# Patient Record
Sex: Male | Born: 1955 | Race: White | Hispanic: No | Marital: Married | Smoking: Never smoker
Health system: Southern US, Community
[De-identification: ages and names within clinical notes are randomized; demographics above are authoritative.]

## PROBLEM LIST (undated history)

## (undated) DIAGNOSIS — F329 Major depressive disorder, single episode, unspecified: Secondary | ICD-10-CM

## (undated) DIAGNOSIS — I10 Essential (primary) hypertension: Secondary | ICD-10-CM

## (undated) DIAGNOSIS — N529 Male erectile dysfunction, unspecified: Secondary | ICD-10-CM

## (undated) DIAGNOSIS — Z860101 Personal history of adenomatous and serrated colon polyps: Secondary | ICD-10-CM

## (undated) DIAGNOSIS — Z86010 Personal history of colonic polyps: Secondary | ICD-10-CM

## (undated) DIAGNOSIS — G4730 Sleep apnea, unspecified: Secondary | ICD-10-CM

## (undated) DIAGNOSIS — K5730 Diverticulosis of large intestine without perforation or abscess without bleeding: Secondary | ICD-10-CM

## (undated) DIAGNOSIS — E782 Mixed hyperlipidemia: Secondary | ICD-10-CM

## (undated) DIAGNOSIS — E538 Deficiency of other specified B group vitamins: Secondary | ICD-10-CM

## (undated) DIAGNOSIS — M5430 Sciatica, unspecified side: Secondary | ICD-10-CM

## (undated) DIAGNOSIS — D508 Other iron deficiency anemias: Secondary | ICD-10-CM

## (undated) HISTORY — DX: Male erectile dysfunction, unspecified: N52.9

## (undated) HISTORY — DX: Other iron deficiency anemias: D50.8

## (undated) HISTORY — DX: Major depressive disorder, single episode, unspecified: F32.9

## (undated) HISTORY — DX: Diverticulosis of large intestine without perforation or abscess without bleeding: K57.30

## (undated) HISTORY — DX: Essential (primary) hypertension: I10

## (undated) HISTORY — DX: Mixed hyperlipidemia: E78.2

## (undated) HISTORY — DX: Deficiency of other specified B group vitamins: E53.8

## (undated) HISTORY — DX: Personal history of adenomatous and serrated colon polyps: Z86.0101

## (undated) HISTORY — DX: Sciatica, unspecified side: M54.30

## (undated) HISTORY — DX: Personal history of colonic polyps: Z86.010

## (undated) HISTORY — DX: Sleep apnea, unspecified: G47.30

---

## 2000-12-04 ENCOUNTER — Emergency Department (HOSPITAL_COMMUNITY): Admission: EM | Admit: 2000-12-04 | Discharge: 2000-12-05 | Payer: Self-pay | Admitting: Emergency Medicine

## 2001-02-24 ENCOUNTER — Encounter: Payer: Self-pay | Admitting: Family Medicine

## 2001-02-24 ENCOUNTER — Encounter: Admission: RE | Admit: 2001-02-24 | Discharge: 2001-02-24 | Payer: Self-pay | Admitting: Family Medicine

## 2002-11-04 ENCOUNTER — Encounter: Payer: Self-pay | Admitting: Family Medicine

## 2002-11-04 ENCOUNTER — Encounter: Admission: RE | Admit: 2002-11-04 | Discharge: 2002-11-04 | Payer: Self-pay | Admitting: Family Medicine

## 2006-03-16 ENCOUNTER — Encounter: Admission: RE | Admit: 2006-03-16 | Discharge: 2006-03-16 | Payer: Self-pay | Admitting: Occupational Medicine

## 2007-11-16 IMAGING — CR DG CHEST 1V
1 series · 1 of 1 positions shown · non-contrast
Comparison: none

CLINICAL DATA: Annual physical exam. 
 CHEST ? 1 VIEW:

[view not recorded]
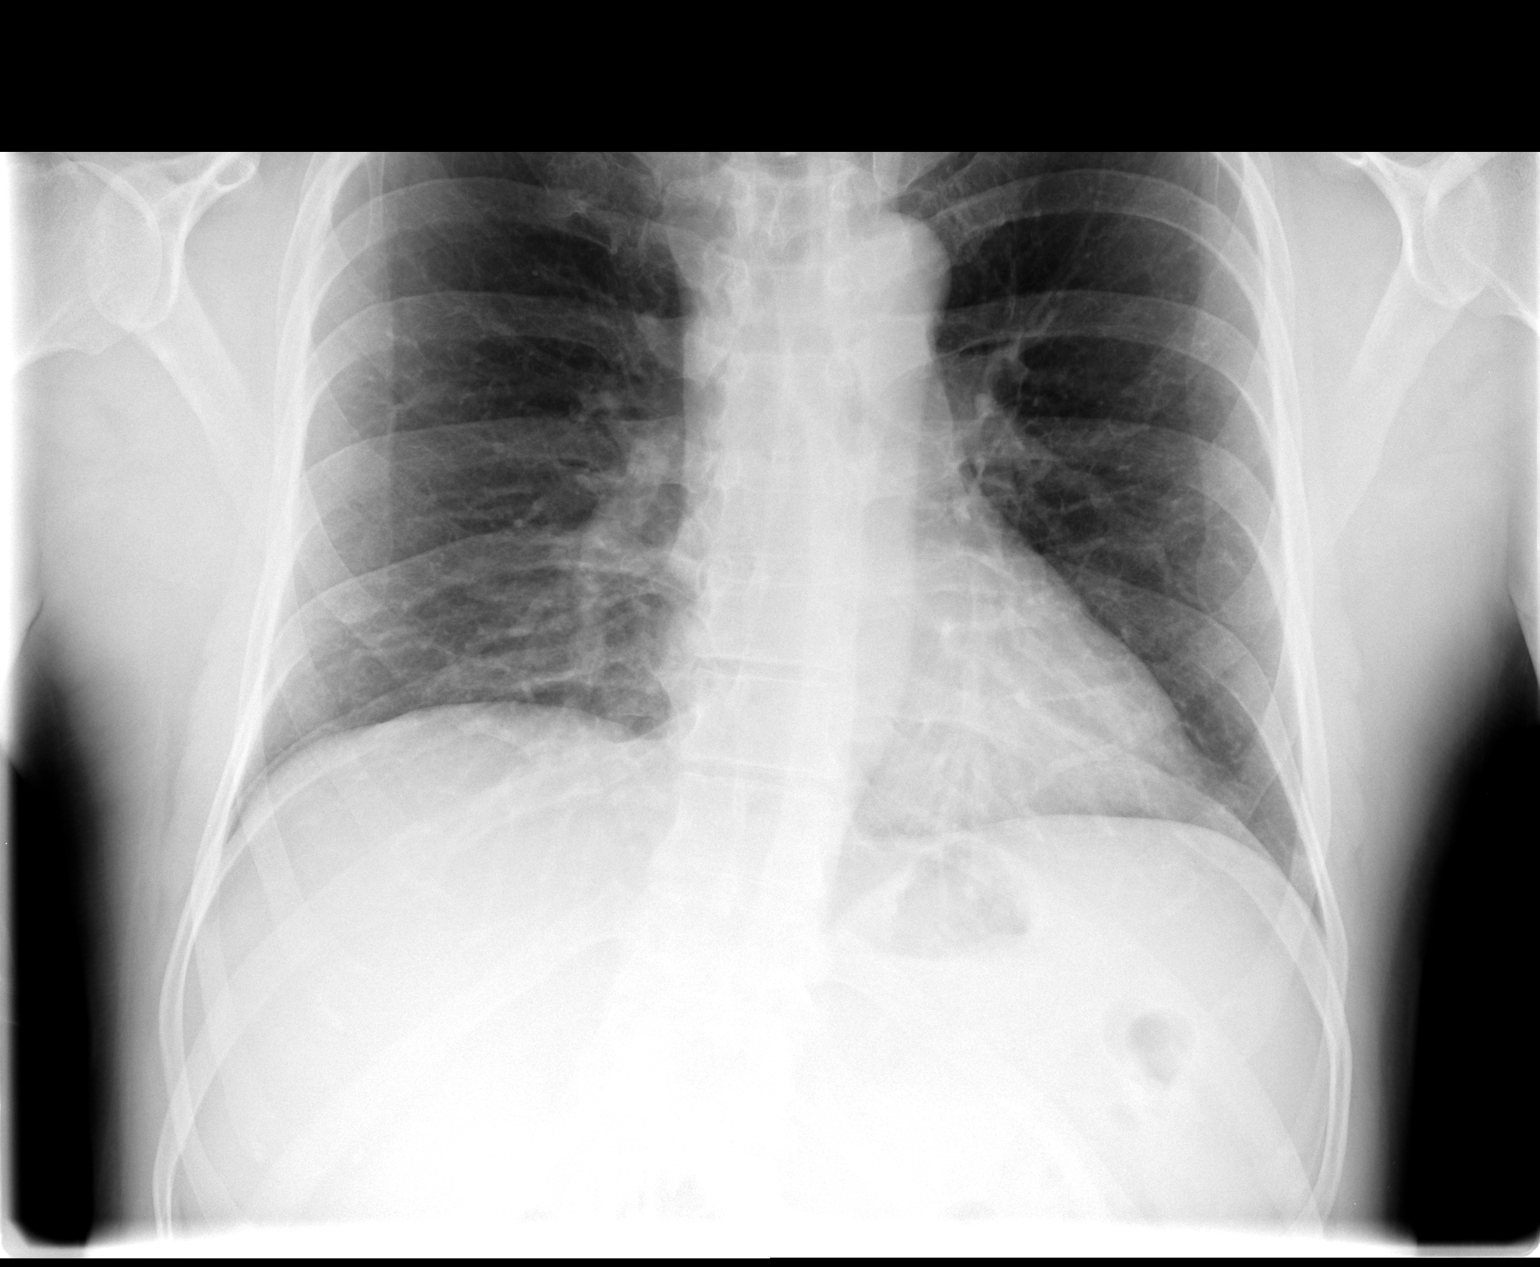

[1 of 1 positions shown; findings below may reference images not displayed]

FINDINGS: A single view of the chest shows the lungs to be clear.  The heart is within normal limits in size.  No bony abnormality is seen.
IMPRESSION: No active lung disease.

## 2012-10-18 ENCOUNTER — Ambulatory Visit
Admission: RE | Admit: 2012-10-18 | Discharge: 2012-10-18 | Disposition: A | Discharge status: Home / Self Care | Payer: No Typology Code available for payment source | Source: Ambulatory Visit | Attending: Occupational Medicine | Admitting: Occupational Medicine

## 2012-10-18 ENCOUNTER — Other Ambulatory Visit: Payer: Self-pay | Admitting: Occupational Medicine

## 2012-10-18 DIAGNOSIS — Z0000 Encounter for general adult medical examination without abnormal findings: Secondary | ICD-10-CM

## 2013-03-19 ENCOUNTER — Encounter (HOSPITAL_COMMUNITY): Payer: Self-pay | Admitting: Emergency Medicine

## 2013-03-19 ENCOUNTER — Emergency Department (HOSPITAL_COMMUNITY)
Admission: EM | Admit: 2013-03-19 | Discharge: 2013-03-19 | Disposition: A | Discharge status: Home / Self Care | Payer: 59 | Attending: Emergency Medicine | Admitting: Emergency Medicine

## 2013-03-19 DIAGNOSIS — R079 Chest pain, unspecified: Secondary | ICD-10-CM

## 2013-03-19 DIAGNOSIS — Z7982 Long term (current) use of aspirin: Secondary | ICD-10-CM | POA: Insufficient documentation

## 2013-03-19 DIAGNOSIS — R0789 Other chest pain: Secondary | ICD-10-CM | POA: Insufficient documentation

## 2013-03-19 DIAGNOSIS — Z79899 Other long term (current) drug therapy: Secondary | ICD-10-CM | POA: Insufficient documentation

## 2013-03-19 LAB — CBC
HCT: 40.2 % (ref 39.0–52.0)
Hemoglobin: 13.4 g/dL (ref 13.0–17.0)
MCV: 72.2 fL — ABNORMAL LOW (ref 78.0–100.0)
RBC: 5.57 MIL/uL (ref 4.22–5.81)
WBC: 6.6 10*3/uL (ref 4.0–10.5)

## 2013-03-19 LAB — BASIC METABOLIC PANEL
BUN: 12 mg/dL (ref 6–23)
CO2: 27 mEq/L (ref 19–32)
Chloride: 102 mEq/L (ref 96–112)
Creatinine, Ser: 1.11 mg/dL (ref 0.50–1.35)
Glucose, Bld: 95 mg/dL (ref 70–99)

## 2013-03-19 MED ORDER — NITROGLYCERIN 0.4 MG SL SUBL: 0.4000 mg | SUBLINGUAL_TABLET | SUBLINGUAL | Status: DC | PRN

## 2013-03-19 NOTE — ED Notes (Signed)
Pt states he was doing some strenuous work this morning and began having CP at 1200.  Pt states pain at 2/10 described as constant dull aching pressure.

## 2013-03-19 NOTE — ED Notes (Signed)
Moving a wood spliter, and developed central cp. Went to fire station, had a 12 ECG done, and took x 324 mg asa.

## 2013-03-19 NOTE — ED Notes (Signed)
Dr. Piedad Climes to hold of IV and NTG due to ? Chest wall pain.

## 2013-03-19 NOTE — ED Provider Notes (Signed)
CSN: [id number]     Arrival date & time 03/19/13  1750 History   First MD Initiated Contact with Patient 03/19/13 1946     Chief Complaint  Patient presents with  . Chest Pain   (Consider location/radiation/quality/duration/timing/severity/associated sxs/prior Treatment) HPI Christian Landry is a 57 y.o. male who presents to the emergency department for chest pain.  Patient reports that he was doing some strenuous working outside earlier today and then about an hour after the work he noticed a mild pain in his chest.  Repeatedly confirms that this is nonexertional.  Described as dull.  Located in center of chest.  Reproducible by moving head in a certain way.  No alleviating factors.  No pain radiation.  No SOB.  No cough.  No leg swelling.  No hemoptysis.  No recent surgeries/traumas.  Not ripping or tearing.  No other symptoms.  History reviewed. No pertinent past medical history. History reviewed. No pertinent past surgical history.  Family History: Reviewed.  None pertinent.    History  Substance Use Topics  . Smoking status: Never Smoker   . Smokeless tobacco: Not on file  . Alcohol Use: No    Review of Systems  Constitutional: Negative for fever and chills.  HENT: Negative for congestion and sore throat.   Respiratory: Negative for cough.   Cardiovascular: Positive for chest pain.  Gastrointestinal: Negative for nausea, vomiting, abdominal pain, diarrhea and constipation.  Endocrine: Negative for polyuria.  Genitourinary: Negative for dysuria and hematuria.  Musculoskeletal: Negative for neck pain.  Skin: Negative for rash.  Neurological: Negative for headaches.  Psychiatric/Behavioral: Negative.   All other systems reviewed and are negative.    Allergies  Review of patient's allergies indicates no known allergies.  Home Medications   Current Outpatient Rx  Name  Route  Sig  Dispense  Refill  . aspirin EC 81 MG tablet   Oral   Take 81 mg by mouth daily.          . Omega-3 Fatty Acids (FISH OIL) 1000 MG CAPS   Oral   Take 1,000 mg by mouth daily.          BP 137/87  Pulse 62  Temp(Src) 98.1 F (36.7 C) (Oral)  Resp 14  Ht 5\' 10"  (1.778 m)  Wt 195 lb (88.451 kg)  BMI 27.98 kg/m2  SpO2 99% Physical Exam  Nursing note and vitals reviewed. Constitutional: He is oriented to person, place, and time. He appears well-developed and well-nourished. No distress.  HENT:  Head: Normocephalic and atraumatic.  Right Ear: External ear normal.  Left Ear: External ear normal.  Mouth/Throat: Oropharynx is clear and moist. No oropharyngeal exudate.  Eyes: Conjunctivae are normal. Pupils are equal, round, and reactive to light. Right eye exhibits no discharge.  Neck: Normal range of motion. Neck supple. No tracheal deviation present.  Cardiovascular: Normal rate, regular rhythm and intact distal pulses.   Pulmonary/Chest: Effort normal. No respiratory distress. He has no wheezes. He has no rales.  Abdominal: Soft. He exhibits no distension. There is no tenderness. There is no rebound and no guarding.  Musculoskeletal: Normal range of motion.  Neurological: He is alert and oriented to person, place, and time.  Skin: Skin is warm and dry. No rash noted. He is not diaphoretic.  Psychiatric: He has a normal mood and affect.    ED Course  Procedures (including critical care time) Labs Review Labs Reviewed  CBC - Abnormal; Notable for the following:  MCV 72.2 (*)    MCH 24.1 (*)    All other components within normal limits  BASIC METABOLIC PANEL - Abnormal; Notable for the following:    GFR calc non Af Amer 73 (*)    GFR calc Af Amer 85 (*)    All other components within normal limits  TROPONIN I  POCT I-STAT TROPONIN I   Imaging Review No results found.  EKG Interpretation    Date/Time:  Saturday March 19 2013 17:55:25 EST Ventricular Rate:  78 PR Interval:  184 QRS Duration: 80 QT Interval:  352 QTC Calculation: 401 R  Axis:   90 Text Interpretation:  Normal sinus rhythm Rightward axis Borderline ECG ED PHYSICIAN INTERPRETATION AVAILABLE IN [facility] Confirmed by TEST, RECORD ([id number]) on 03/21/2013 7:07:54 AM            MDM   1. Chest pain    Christian Landry is a 57 y.o. male who presents to the emergency department with central chest pain that started at rest.  Very convincing for MSK pain.  Atypical story for ACS and delta troponin checked and negative.  Highly doubt PE.  No ripping/tearing or severe pain to indicate dissection.  CXR without abnormality.  No indication for CT or admission.  Patient's pain improved spontaneously.  Safe for discharge home.  Patient discharged.   Arloa Koh, MD 03/21/13 1501

## 2013-03-23 NOTE — ED Provider Notes (Signed)
I saw and evaluated the patient, reviewed the resident's note and I agree with the findings and plan.  EKG Interpretation    Date/Time:  Saturday March 19 2013 17:55:25 EST Ventricular Rate:  78 PR Interval:  184 QRS Duration: 80 QT Interval:  352 QTC Calculation: 401 R Axis:   90 Text Interpretation:  Normal sinus rhythm Rightward axis Borderline ECG ED PHYSICIAN INTERPRETATION AVAILABLE IN [facility] Confirmed by TEST, RECORD ([id number]) on 03/21/2013 7:07:54 AM            Patient seen for atypical chest pain. He began after doing some exertion, but not for an hour after the strenuous activity, no pain during strenuous activity. Patient's cardiac workup including 2 troponins were negative, patient discharged to followup as an outpatient.  Gilda Crease, MD 03/23/13 [phone]

## 2016-05-06 DIAGNOSIS — H5203 Hypermetropia, bilateral: Secondary | ICD-10-CM | POA: Diagnosis not present

## 2016-06-24 DIAGNOSIS — Z125 Encounter for screening for malignant neoplasm of prostate: Secondary | ICD-10-CM | POA: Diagnosis not present

## 2016-06-24 DIAGNOSIS — Z0000 Encounter for general adult medical examination without abnormal findings: Secondary | ICD-10-CM | POA: Diagnosis not present

## 2016-10-03 DIAGNOSIS — J069 Acute upper respiratory infection, unspecified: Secondary | ICD-10-CM | POA: Diagnosis not present

## 2017-06-25 DIAGNOSIS — Z23 Encounter for immunization: Secondary | ICD-10-CM | POA: Diagnosis not present

## 2017-06-25 DIAGNOSIS — Z125 Encounter for screening for malignant neoplasm of prostate: Secondary | ICD-10-CM | POA: Diagnosis not present

## 2017-06-25 DIAGNOSIS — N529 Male erectile dysfunction, unspecified: Secondary | ICD-10-CM | POA: Diagnosis not present

## 2017-06-25 DIAGNOSIS — Z0000 Encounter for general adult medical examination without abnormal findings: Secondary | ICD-10-CM | POA: Diagnosis not present

## 2018-06-28 DIAGNOSIS — Z13220 Encounter for screening for lipoid disorders: Secondary | ICD-10-CM | POA: Diagnosis not present

## 2018-06-28 DIAGNOSIS — Z0000 Encounter for general adult medical examination without abnormal findings: Secondary | ICD-10-CM | POA: Diagnosis not present

## 2018-07-01 DIAGNOSIS — Z23 Encounter for immunization: Secondary | ICD-10-CM | POA: Diagnosis not present

## 2019-03-02 ENCOUNTER — Other Ambulatory Visit: Payer: Self-pay

## 2019-03-02 DIAGNOSIS — Z20822 Contact with and (suspected) exposure to covid-19: Secondary | ICD-10-CM

## 2019-03-04 LAB — NOVEL CORONAVIRUS, NAA: SARS-CoV-2, NAA: NOT DETECTED

## 2019-03-16 ENCOUNTER — Other Ambulatory Visit: Payer: Self-pay

## 2019-03-16 DIAGNOSIS — Z20822 Contact with and (suspected) exposure to covid-19: Secondary | ICD-10-CM

## 2019-03-17 LAB — NOVEL CORONAVIRUS, NAA: SARS-CoV-2, NAA: NOT DETECTED

## 2019-03-18 ENCOUNTER — Telehealth: Payer: Self-pay

## 2019-03-18 NOTE — Telephone Encounter (Signed)
Caller given negative result and verbalized understanding  

## 2021-03-05 ENCOUNTER — Encounter: Payer: Self-pay | Admitting: Physician Assistant

## 2021-03-11 ENCOUNTER — Other Ambulatory Visit: Payer: Self-pay

## 2021-03-11 ENCOUNTER — Encounter: Payer: Self-pay | Admitting: Physician Assistant

## 2021-03-11 ENCOUNTER — Ambulatory Visit: Payer: 59 | Admitting: Physician Assistant

## 2021-03-11 ENCOUNTER — Other Ambulatory Visit (INDEPENDENT_AMBULATORY_CARE_PROVIDER_SITE_OTHER): Payer: 59

## 2021-03-11 VITALS — BP 122/69 | HR 73 | Resp 18 | Ht 70.0 in | Wt 178.0 lb

## 2021-03-11 DIAGNOSIS — G4730 Sleep apnea, unspecified: Secondary | ICD-10-CM | POA: Diagnosis not present

## 2021-03-11 DIAGNOSIS — R413 Other amnesia: Secondary | ICD-10-CM | POA: Diagnosis not present

## 2021-03-11 LAB — CBC
HCT: 41.6 % (ref 39.0–52.0)
Hemoglobin: 13.8 g/dL (ref 13.0–17.0)
MCHC: 33.2 g/dL (ref 30.0–36.0)
MCV: 84.6 fl (ref 78.0–100.0)
Platelets: 348 10*3/uL (ref 150.0–400.0)
RBC: 4.92 Mil/uL (ref 4.22–5.81)
RDW: 14.1 % (ref 11.5–15.5)
WBC: 7.9 10*3/uL (ref 4.0–10.5)

## 2021-03-11 LAB — LIPID PANEL
Cholesterol: 197 mg/dL (ref 0–200)
HDL: 57.2 mg/dL (ref >39.00)
LDL Cholesterol: 112 mg/dL — ABNORMAL HIGH (ref 0–99)
NonHDL: 139.72
Total CHOL/HDL Ratio: 3
Triglycerides: 141 mg/dL (ref 0.0–149.0)
VLDL: 28.2 mg/dL (ref 0.0–40.0)

## 2021-03-11 LAB — TSH: TSH: 2.59 u[IU]/mL (ref 0.35–5.50)

## 2021-03-11 LAB — VITAMIN B12: Vitamin B-12: 205 pg/mL — ABNORMAL LOW (ref 211–911)

## 2021-03-11 LAB — FOLATE: Folate: >23.4 ng/mL (ref >5.9)

## 2021-03-11 LAB — FERRITIN: Ferritin: 30.6 ng/mL (ref 22.0–322.0)

## 2021-03-11 NOTE — Progress Notes (Signed)
Assessment/Plan:   MAXXWELL EDGETT is a very pleasant 65 y.o. year old RH male with risk factors including family history of dementia, and hypertension, hyperlipidemia, possible OSA and sleep deprivation, anemia, seen today for evaluation of memory loss. MoCA today is 27/30, with only deficiency in delayed recall 2/5.    Recommendations:   Memory Loss   MRI brain with/without contrast to assess for underlying structural abnormality and assess vascular load  Neurocognitive testing to further evaluate cognitive concerns and determine underlying cause of memory changes, including potential contribution from sleep, anxiety, or depression  Check B12, TSH, CBC, lipid panel, iron studies Discussed safety both in and out of the home.  Discussed the importance of regular daily schedule with inclusion of crossword puzzles to maintain brain function.  Continue to monitor mood with PCP.  Stay active at least 30 minutes at least 3 times a week.  Naps should be scheduled and should be no longer than 60 minutes and should not occur after 2 PM.  Sleep study for evaluation of sleep apnea Mediterranean diet is recommended  Folllow up once results above are available   Subjective:    The patient is seen in neurologic consultation at the request of Blair Heys, MD for the evaluation of memory.  The patient is accompanied by his wife who supplements the history. This is a 65 y.o. year old male who has had gradual memory issues for about 2 or 3 years, worse over the last year.  His wife noticed that the symptoms are worse over the last year.  She states that he is overloaded with work, he is participating in [employer], and despite being very organized, over the last year he has been having trouble with organization.  He usually keeps a pocket calendar, but he has been misplacing it, which creates some frustration.  "He has to write it in a piece of paper otherwise he becomes very frustrated ".  She  also reports that he is more confused, cannot complete a task, has trouble finding objects, and becomes "overloaded ".  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  She has a history of anxiety, and this creates increased anxiety in her and in the relationship according to her.  He may go to the basement and "can find something very quickly, but he cannot remember if he lost something in the first place unless he speaks out loud ".  He likes to cook, denies leaving the stove on.  His wife states that he has lost recipes that usually are in the collection of recipes in the pantry.  She states that his response will be "I do not remember that I was looking for them ". He puts his clean laundry in the  basket and rolls  the socks in a specific way but lately he has " randomly tied them in a knot " which was uncharacteristic. He does asked the same questions and repeats the same stories. He continues to drive, and recently, they had not slept for about 2 days due to their son's wedding, which increased stress during that time, and upon turning into the driveway, he lost direction of the car.  As mentioned earlier, he reports "this is not related to confusion, it was a lack of sleep ".  Of note, he reports that goes to sleep at midnight, may wake up between 2 and 3 in the morning, "I only sleep about 3 hours and go  to the office at 6:30 AM ".  His wife states that he snores loudly.  He has never been tested for sleep apnea.   He denies a history of depression, but he does report that he is sad  that " this is making her sad". He denies hallucinations or paranoia, no hygiene concerns are noted, he is independent of bathing and dressing.  He does not miss any medication doses, and he is independent on finances, does not forget to pay any bills.  His appetite is normal, denies trouble swallowing.  He ambulates without assistance of cane or walker, denies any recent falls.  Years ago, he had a branch of a  tree falling into his forehead, leaving a scar, but he denies that this was deep, he reports that the injury was superficial, no loss of consciousness.Denies headaches, double vision, dizziness, focal numbness or tingling, unilateral weakness or tremors or anosmia. No history of seizures. Denies urine incontinence, retention, constipation or diarrhea.  Denies ETOH or Tobacco. Family History remarkable for both parents with dementia, and one  maternal grand aunt with dementia possibly Alzheimer's     No Known Allergies  Current Outpatient Medications  Medication Instructions   amLODipine (NORVASC) 5 MG tablet amlodipine 5 mg tablet  TAKE 1 TABLET BY MOUTH ONCE DAILY FOR BLOOD PRESSURE   aspirin EC 81 MG tablet 1 tablet   aspirin EC 81 mg, Daily   Fish Oil 1,000 mg, Daily   lisinopril (ZESTRIL) 20 MG tablet lisinopril 20 mg tablet  TAKE 1 TABLET BY MOUTH EVERY DAY FOR BLOOD PRESSURE   Multiple Vitamins-Minerals (MULTIVITAMIN ADULT EXTRA C PO) 1 tablet   sildenafil (VIAGRA) 100 MG tablet SMARTSIG:0.5-1 Tablet(s) By Mouth PRN     VITALS:   Vitals:   03/11/21 0756  BP: 122/69  Pulse: 73  Resp: 18  SpO2: 98%  Weight: 178 lb (80.7 kg)  Height: 5\' 10"  (1.778 m)   No flowsheet data found.  PHYSICAL EXAM   HEENT:  Normocephalic, atraumatic. The mucous membranes are moist. The superficial temporal arteries are without ropiness or tenderness. Cardiovascular: Regular rate and rhythm. Lungs: Clear to auscultation bilaterally. Neck: There are no carotid bruits noted bilaterally.  NEUROLOGICAL: Montreal Cognitive Assessment  03/11/2021  Visuospatial/ Executive (0/5) 5  Naming (0/3) 3  Attention: Read list of digits (0/2) 2  Attention: Read list of letters (0/1) 1  Attention: Serial 7 subtraction starting at 100 (0/3) 3  Language: Repeat phrase (0/2) 2  Language : Fluency (0/1) 1  Abstraction (0/2) 2  Delayed Recall (0/5) 2  Orientation (0/6) 6  Total 27   No flowsheet data found.   No flowsheet data found.   Orientation:  Alert and oriented to person, place and time. No aphasia or dysarthria. Fund of knowledge is appropriate. Recent memory impaired and remote memory intact.  Attention and concentration are normal.  Able to name objects and repeat phrases. Delayed recall 2 /5 Cranial nerves: There is good facial symmetry. Extraocular muscles are intact and visual fields are full to confrontational testing. Speech is fluent and clear. Soft palate rises symmetrically and there is no tongue deviation. Hearing is intact to conversational tone. Tone: Tone is good throughout. Sensation: Sensation is intact to light touch and pinprick throughout. Vibration is intact at the bilateral big toe.There is no extinction with double simultaneous stimulation. There is no sensory dermatomal level identified. Coordination: The patient has no difficulty with RAM's or FNF bilaterally. Normal finger to nose  Motor:  Strength is 5/5 in the bilateral upper and lower extremities. There is no pronator drift. There are no fasciculations noted. DTR's: Deep tendon reflexes are 2/4 at the bilateral biceps, triceps, brachioradialis, patella and achilles.  Plantar responses are downgoing bilaterally. Gait and Station: The patient is able to ambulate without difficulty.The patient is able to heel toe walk without any difficulty.The patient is able to ambulate in a tandem fashion. The patient is able to stand in the Romberg position.     Thank you for allowing [place] the opportunity to participate in the care of this nice patient. Please do not hesitate to contact us for any questions or concerns.   Total time spent on today's visit was 70 minutes, including both face-to-face time and nonface-to-face time.  Time included that spent on review of records (prior notes available to me/labs/imaging if pertinent), discussing treatment and goals, answering patient's questions and coordinating care.  Cc:  Blair Heys, MD  Marlowe Kays 03/11/2021 8:39 AM

## 2021-03-11 NOTE — Patient Instructions (Addendum)
It was a pleasure to see you today at our office.   Recommendations:  Neurocognitive evaluation at our office MRI of the brain, the radiology office will call you to arrange you appointment Check labs today Sleep Study for evaluation of sleep apnea  Follow up after neurocognitive testing  RECOMMENDATIONS FOR ALL PATIENTS WITH MEMORY PROBLEMS: 1. Continue to exercise (Recommend 30 minutes of walking everyday, or 3 hours every week) 2. Increase social interactions - continue going to [place] and enjoy social gatherings with friends and family 3. Eat healthy, avoid fried foods and eat more fruits and vegetables 4. Maintain adequate blood pressure, blood sugar, and blood cholesterol level. Reducing the risk of stroke and cardiovascular disease also helps promoting better memory. 5. Avoid stressful situations. Live a simple life and avoid aggravations. Organize your time and prepare for the next day in anticipation. 6. Sleep well, avoid any interruptions of sleep and avoid any distractions in the bedroom that may interfere with adequate sleep quality 7. Avoid sugar, avoid sweets as there is a strong link between excessive sugar intake, diabetes, and cognitive impairment We discussed the Mediterranean diet, which has been shown to help patients reduce the risk of progressive memory disorders and reduces cardiovascular risk. This includes eating fish, eat fruits and green leafy vegetables, nuts like almonds and hazelnuts, walnuts, and also use olive oil. Avoid fast foods and fried foods as much as possible. Avoid sweets and sugar as sugar use has been linked to worsening of memory function.  There is always a concern of gradual progression of memory problems. If this is the case, then we may need to adjust level of care according to patient needs. Support, both to the patient and caregiver, should then be put into place.      You have been referred for a neuropsychological evaluation (i.e.,  evaluation of memory and thinking abilities). Please bring someone with you to this appointment if possible, as it is helpful for the doctor to hear from both you and another adult who knows you well. Please bring eyeglasses and hearing aids if you wear them.    The evaluation will take approximately 3 hours and has two parts:   The first part is a clinical interview with the neuropsychologist (Dr. Milbert Coulter or Dr. Roseanne Reno). During the interview, the neuropsychologist will speak with you and the individual you brought to the appointment.    The second part of the evaluation is testing with the doctor's technician Annabelle Harman or Selena Batten). During the testing, the technician will ask you to remember different types of material, solve problems, and answer some questionnaires. Your family member will not be present for this portion of the evaluation.   Please note: We must reserve several hours of the neuropsychologist's time and the psychometrician's time for your evaluation appointment. As such, there is a No-Show fee of $100. If you are unable to attend any of your appointments, please contact our office as soon as possible to reschedule.    FALL PRECAUTIONS: Be cautious when walking. Scan the area for obstacles that may increase the risk of trips and falls. When getting up in the mornings, sit up at the edge of the bed for a few minutes before getting out of bed. Consider elevating the bed at the head end to avoid drop of blood pressure when getting up. Walk always in a well-lit room (use night lights in the walls). Avoid area rugs or power cords from appliances in the middle of the walkways. Use  a walker or a cane if necessary and consider physical therapy for balance exercise. Get your eyesight checked regularly.  FINANCIAL OVERSIGHT: Supervision, especially oversight when making financial decisions or transactions is also recommended.  HOME SAFETY: Consider the safety of the kitchen when operating appliances like  stoves, microwave oven, and blender. Consider having supervision and share cooking responsibilities until no longer able to participate in those. Accidents with firearms and other hazards in the house should be identified and addressed as well.   ABILITY TO BE LEFT ALONE: If patient is unable to contact 911 operator, consider using LifeLine, or when the need is there, arrange for someone to stay with patients. Smoking is a fire hazard, consider supervision or cessation. Risk of wandering should be assessed by caregiver and if detected at any point, supervision and safe proof recommendations should be instituted.  MEDICATION SUPERVISION: Inability to self-administer medication needs to be constantly addressed. Implement a mechanism to ensure safe administration of the medications.   DRIVING: Regarding driving, in patients with progressive memory problems, driving will be impaired. We advise to have someone else do the driving if trouble finding directions or if minor accidents are reported. Independent driving assessment is available to determine safety of driving.   If you are interested in the driving assessment, you can contact the following:  [employer] in [place] [phone]  [facility] [phone]  [facility] [phone] or [phone]    Mediterranean Diet A Mediterranean diet refers to food and lifestyle choices that are based on the traditions of countries located on the [employer]. This way of eating has been shown to help prevent certain conditions and improve outcomes for people who have chronic diseases, like kidney disease and heart disease. What are tips for following this plan? Lifestyle  Cook and eat meals together with your family, when possible. Drink enough fluid to keep your urine clear or pale yellow. Be physically active every day. This includes: Aerobic exercise like running  or swimming. Leisure activities like gardening, walking, or housework. Get 7-8 hours of sleep each night. If recommended by your health care provider, drink red wine in moderation. This means 1 glass a day for nonpregnant women and 2 glasses a day for men. A glass of wine equals 5 oz (150 mL). Reading food labels  Check the serving size of packaged foods. For foods such as rice and pasta, the serving size refers to the amount of cooked product, not dry. Check the total fat in packaged foods. Avoid foods that have saturated fat or trans fats. Check the ingredients list for added sugars, such as corn syrup. Shopping  At the grocery store, buy most of your food from the areas near the walls of the store. This includes: Fresh fruits and vegetables (produce). Grains, beans, nuts, and seeds. Some of these may be available in unpackaged forms or large amounts (in bulk). Fresh seafood. Poultry and eggs. Low-fat dairy products. Buy whole ingredients instead of prepackaged foods. Buy fresh fruits and vegetables in-season from local farmers markets. Buy frozen fruits and vegetables in resealable bags. If you do not have access to quality fresh seafood, buy precooked frozen shrimp or canned fish, such as tuna, salmon, or sardines. Buy small amounts of raw or cooked vegetables, salads, or olives from the deli or salad bar at your store. Stock your pantry so you always have certain foods on hand, such as olive oil, canned tuna, canned tomatoes, rice, pasta,  and beans. Cooking  Cook foods with extra-virgin olive oil instead of using butter or other vegetable oils. Have meat as a side dish, and have vegetables or grains as your main dish. This means having meat in small portions or adding small amounts of meat to foods like pasta or stew. Use beans or vegetables instead of meat in common dishes like chili or lasagna. Experiment with different cooking methods. Try roasting or broiling vegetables instead of  steaming or sauteing them. Add frozen vegetables to soups, stews, pasta, or rice. Add nuts or seeds for added healthy fat at each meal. You can add these to yogurt, salads, or vegetable dishes. Marinate fish or vegetables using olive oil, lemon juice, garlic, and fresh herbs. Meal planning  Plan to eat 1 vegetarian meal one day each week. Try to work up to 2 vegetarian meals, if possible. Eat seafood 2 or more times a week. Have healthy snacks readily available, such as: Vegetable sticks with hummus. Greek yogurt. Fruit and nut trail mix. Eat balanced meals throughout the week. This includes: Fruit: 2-3 servings a day Vegetables: 4-5 servings a day Low-fat dairy: 2 servings a day Fish, poultry, or lean meat: 1 serving a day Beans and legumes: 2 or more servings a week Nuts and seeds: 1-2 servings a day Whole grains: 6-8 servings a day Extra-virgin olive oil: 3-4 servings a day Limit red meat and sweets to only a few servings a month What are my food choices? Mediterranean diet Recommended Grains: Whole-grain pasta. Brown rice. Bulgar wheat. Polenta. Couscous. Whole-wheat bread. [place]. Vegetables: Artichokes. Beets. Broccoli. Cabbage. Carrots. Eggplant. Green beans. Chard. Kale. Spinach. Onions. Leeks. Peas. Squash. Tomatoes. Peppers. Radishes. Fruits: Apples. Apricots. Avocado. Berries. Bananas. Cherries. Dates. Figs. Grapes. Lemons. Melon. Oranges. Peaches. Plums. Pomegranate. Meats and other protein foods: Beans. Almonds. Sunflower seeds. Pine nuts. Peanuts. [place]. Salmon. Scallops. Shrimp. Thorp. Tilapia. Clams. Oysters. Eggs. Dairy: Low-fat milk. Cheese. Greek yogurt. Beverages: Water. Red wine. Herbal tea. Fats and oils: Extra virgin olive oil. Avocado oil. Grape seed oil. Sweets and desserts: [place] yogurt with honey. Baked apples. Poached pears. Trail mix. Seasoning and other foods: Basil. Cilantro. Coriander. Cumin. Mint. Parsley. Sage. Rosemary. Tarragon. Garlic.  Oregano. Thyme. Pepper. Balsalmic vinegar. Tahini. Hummus. Tomato sauce. Olives. Mushrooms. Limit these Grains: Prepackaged pasta or rice dishes. Prepackaged cereal with added sugar. Vegetables: Deep fried potatoes (french fries). Fruits: Fruit canned in syrup. Meats and other protein foods: Beef. Pork. Lamb. Poultry with skin. Hot dogs. Berniece Salines. Dairy: Ice cream. Sour cream. Whole milk. Beverages: Juice. Sugar-sweetened soft drinks. Beer. Liquor and spirits. Fats and oils: Butter. Canola oil. Vegetable oil. Beef fat (tallow). Lard. Sweets and desserts: Cookies. Cakes. Pies. Candy. Seasoning and other foods: Mayonnaise. Premade sauces and marinades. The items listed may not be a complete list. Talk with your dietitian about what dietary choices are right for you. Summary The Mediterranean diet includes both food and lifestyle choices. Eat a variety of fresh fruits and vegetables, beans, nuts, seeds, and whole grains. Limit the amount of red meat and sweets that you eat. Talk with your health care provider about whether it is safe for you to drink red wine in moderation. This means 1 glass a day for nonpregnant women and 2 glasses a day for men. A glass of wine equals 5 oz (150 mL). This information is not intended to replace advice given to you by your health care provider. Make sure you discuss any questions you have with your health care provider.  Document Released: 11/15/2015 Document Revised: 12/18/2015 Document Reviewed: 11/15/2015 Elsevier Interactive Patient Education  2017 [employer].  We have sent a referral to [facility] for your MRI and they will call you directly to schedule your appointment. They are located at [address] [facility]. If you need to contact them directly please call [phone].   Your provider has requested that you have labwork completed today. Please go to [facility] ([id number]) on the second floor of this building before leaving the office  today. You do not need to check in. If you are not called within 15 minutes please check with the front desk.

## 2021-03-12 LAB — IRON AND TIBC
Iron Saturation: 25 % (ref 15–55)
Iron: 97 ug/dL (ref 38–169)
Total Iron Binding Capacity: 383 ug/dL (ref 250–450)
UIBC: 286 ug/dL (ref 111–343)

## 2021-03-13 ENCOUNTER — Telehealth: Payer: Self-pay | Admitting: Physician Assistant

## 2021-03-13 NOTE — Telephone Encounter (Signed)
Pt wife Christian Landry called back in and was informed Unfortunately, we will have to wait for the neuropsychological evaluation for his memory. Mood issues will be best addressed by Psychiatric referral, will be happy to refer if interested she stated that he would NOT do the Psychiatric referral they will wait for the neuropsychological testing,

## 2021-03-13 NOTE — Telephone Encounter (Signed)
Pt wife called no answer left a voice mail to call the office back  

## 2021-03-14 ENCOUNTER — Telehealth: Payer: Self-pay | Admitting: Physician Assistant

## 2021-03-14 NOTE — Telephone Encounter (Signed)
Call completed, spoke with patient.

## 2021-03-14 NOTE — Telephone Encounter (Signed)
Patient stated he is returning a call to someone [phone]

## 2021-03-14 NOTE — Progress Notes (Signed)
No answer at 9:12am

## 2021-03-18 ENCOUNTER — Telehealth: Payer: Self-pay | Admitting: Physician Assistant

## 2021-03-18 NOTE — Telephone Encounter (Signed)
Pt's wife called in and left a message. She stated the patient had gotten lab results and there was something wrong, but he cannot remember what was wrong.

## 2021-03-27 ENCOUNTER — Ambulatory Visit
Admission: RE | Admit: 2021-03-27 | Discharge: 2021-03-27 | Disposition: A | Discharge status: Home / Self Care | Payer: 59 | Source: Ambulatory Visit | Attending: Physician Assistant | Admitting: Physician Assistant

## 2021-03-27 ENCOUNTER — Other Ambulatory Visit: Payer: Self-pay

## 2021-04-03 NOTE — Telephone Encounter (Signed)
Patient advised of labs and mri

## 2021-04-03 NOTE — Telephone Encounter (Signed)
Pt's wife called back in to get results. She never heard back from when she called on 03/18/21. The pt was given results, but doesn't remember what they were.

## 2021-04-12 NOTE — Progress Notes (Signed)
04/15/21- 43 yoM never smoker for sleep evaluation courtesy of Dr Jeanmarie Hubert with concern of OSA. Retired Doctor, hospital includes Dementia, HTN, Lumbar Radiculitis,  Epworth score-9 Body weight today- Covid vax-3 Phizer, 1 Moderna Flu vax-had -----Patient and his wife state that he snores, is restless, wakes up 2 times a night to go to bathroom. States that he does not have trouble going to sleep. Wife states that he does gasp sometimes.  Wife has CPAP for OSA and believes strongly that it helps her. I think the greatest concern they have is ongoing evaluation for decreased memory with family history of Alzheimer's. She describes parasomnia activity with restless moving and sleep, some punching at her in dreams, and kicking. 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  He denies any sleep medicines or any history of ENT surgery, heart or lung disease.  Wife emphasizes that he stays extremely busy "strong work ethic", although he is retired.  Always has projects.  He drinks about a liter of Pepsi daily.  Prior to Admission medications   Medication Sig Start Date End Date Taking? Authorizing Provider  amLODipine (NORVASC) 5 MG tablet amlodipine 5 mg tablet  TAKE 1 TABLET BY MOUTH ONCE DAILY FOR BLOOD PRESSURE   Yes [provider]  aspirin EC 81 MG tablet Take 81 mg by mouth daily.   Yes [provider]  aspirin EC 81 MG tablet 1 tablet   Yes [provider]  cyanocobalamin 1000 MCG tablet Take 1,000 mcg by mouth daily.   Yes [provider]  lisinopril (ZESTRIL) 20 MG tablet lisinopril 20 mg tablet  TAKE 1 TABLET BY MOUTH EVERY DAY FOR BLOOD PRESSURE 08/23/19  Yes [provider]  Multiple Vitamins-Minerals (MULTIVITAMIN ADULT EXTRA C PO) 1 tablet   Yes [provider]  Omega-3 Fatty Acids (FISH OIL) 1000 MG CAPS Take 1,000 mg by mouth daily.   Yes [provider]   sildenafil (VIAGRA) 100 MG tablet SMARTSIG:0.5-1 Tablet(s) By Mouth PRN 12/15/20  Yes [provider]   Past Medical History:  Diagnosis Date   Hypertension    premenstrual tension syndrome History reviewed. No pertinent family history. Social History   Socioeconomic History   Marital status: Married    Spouse name: Not on file   Number of children: 1   Years of education: 14   Highest education level: Not on file  Occupational History   Not on file  Tobacco Use   Smoking status: Never   Smokeless tobacco: Never  Vaping Use   Vaping Use: Never used  Substance and Sexual Activity   Alcohol use: No   Drug use: No   Sexual activity: Yes  Other Topics Concern   Not on file  Social History Narrative   Right handed   Drinks caffeine   Two story home   Social Determinants of Health   Financial Resource Strain: Not on file  Food Insecurity: Not on file  Transportation Needs: Not on file  Physical Activity: Not on file  Stress: Not on file  Social Connections: Not on file  Intimate Partner Violence: Not on file   ROS-see HPI   + = positive Constitutional:    weight loss, night sweats, fevers, chills, fatigue, lassitude. HEENT:    headaches, difficulty swallowing, tooth/dental problems, sore throat,       sneezing, itching, ear ache, nasal congestion, post nasal drip, snoring CV:    chest pain, orthopnea,  PND, swelling in lower extremities, anasarca,                                   dizziness, palpitations Resp:   shortness of breath with exertion or at rest.                productive cough,   non-productive cough, coughing up of blood.              change in color of mucus.  wheezing.   Skin:    rash or lesions. GI:  No-   heartburn, indigestion, abdominal pain, nausea, vomiting, diarrhea,                 change in bowel habits, loss of appetite GU: dysuria, change in color of urine, no urgency or frequency.   flank pain. MS:   joint pain, stiffness,  decreased range of motion, back pain. Neuro-     nothing unusual Psych:  change in mood or affect.  depression or anxiety.   memory loss.  OBJ- Physical Exam General- Alert, Oriented, Affect-appropriate, Distress- none acute, +Lean Skin- rash-none, lesions- none, excoriation- none Lymphadenopathy- none Head- atraumatic            Eyes- Gross vision intact, PERRLA, conjunctivae and secretions clear            Ears- Hearing, canals-normal            Nose- Clear, no-Septal dev, mucus, polyps, erosion, perforation             Throat- Mallampati III-IV , mucosa clear , drainage- none, tonsils- atrophic, + teeth Neck- flexible , trachea midline, no stridor , thyroid nl, carotid no bruit Chest - symmetrical excursion , unlabored           Heart/CV- RRR , no murmur , no gallop  , no rub, nl s1 s2                           - JVD- none , edema- none, stasis changes- none, varices- none           Lung- clear to P&A, wheeze- none, cough- none , dullness-none, rub- none           Chest wall-  Abd-  Br/ Gen/ Rectal- Not done, not indicated Extrem- cyanosis- none, clubbing, none, atrophy- none, strength- nl Neuro- grossly intact to observation

## 2021-04-15 ENCOUNTER — Other Ambulatory Visit: Payer: Self-pay

## 2021-04-15 ENCOUNTER — Ambulatory Visit (INDEPENDENT_AMBULATORY_CARE_PROVIDER_SITE_OTHER): Payer: 59 | Admitting: Internal Medicine

## 2021-04-15 ENCOUNTER — Encounter: Payer: Self-pay | Admitting: Internal Medicine

## 2021-04-15 DIAGNOSIS — G4752 REM sleep behavior disorder: Secondary | ICD-10-CM | POA: Diagnosis not present

## 2021-04-15 DIAGNOSIS — R0683 Snoring: Secondary | ICD-10-CM

## 2021-04-15 DIAGNOSIS — G4733 Obstructive sleep apnea (adult) (pediatric): Secondary | ICD-10-CM | POA: Insufficient documentation

## 2021-04-15 HISTORY — DX: REM sleep behavior disorder: G47.52

## 2021-04-15 HISTORY — DX: Snoring: R06.83

## 2021-04-15 HISTORY — DX: Obstructive sleep apnea (adult) (pediatric): G47.33

## 2021-04-15 NOTE — Assessment & Plan Note (Signed)
Wife describes his activities and sleep as more directed than simple random twitching and jerking related to respiratory arousals.  In the context of a dementia work-up, this is pertinent.  After we see results of sleep study we can discuss intervention by [place] or his neurologist.

## 2021-04-15 NOTE — Assessment & Plan Note (Signed)
History and exam suggest probable OSA.  Appropriate discussion done.  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  This makes it appropriate to schedule an in-center attended sleep study. Plan sleep study scheduled.

## 2021-04-15 NOTE — Patient Instructions (Addendum)
Order- schedule split night sleep study     dx snoring  Please call us about 2 weeks after your sleep study for results and recommendations

## 2021-04-24 ENCOUNTER — Telehealth: Payer: Self-pay | Admitting: Internal Medicine

## 2021-04-24 DIAGNOSIS — R0683 Snoring: Secondary | ICD-10-CM

## 2021-04-24 NOTE — Telephone Encounter (Signed)
According to our records, split night sleep study was not ordered during 04/15/2021 visit.  I have ordered this.  Lm to make patient aware.

## 2021-05-14 ENCOUNTER — Telehealth: Payer: Self-pay | Admitting: Internal Medicine

## 2021-05-14 NOTE — Telephone Encounter (Signed)
Called patient's wife but she did not answer. Left message for her to call back.  ?
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
## 2021-05-16 NOTE — Telephone Encounter (Signed)
I spoke with wife she is scared to death for her husband sake. She would like to move up his appt please help. She ask. I be happy to call her back again in the am [phone], I advised 911 if she felt uncomfortable with him.

## 2021-05-17 ENCOUNTER — Encounter: Payer: Self-pay | Admitting: Physician Assistant

## 2021-05-17 ENCOUNTER — Other Ambulatory Visit: Payer: Self-pay

## 2021-05-17 DIAGNOSIS — G4752 REM sleep behavior disorder: Secondary | ICD-10-CM

## 2021-05-17 MED ORDER — ESCITALOPRAM OXALATE 10 MG PO TABS: 10.0000 mg | ORAL_TABLET | Freq: Every day | ORAL | 3 refills | Status: DC
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
## 2021-05-17 NOTE — Telephone Encounter (Signed)
Referral to psychiatry placed.

## 2021-05-17 NOTE — Addendum Note (Signed)
Addended by: Marlowe Kays E on: 05/17/2021 08:51 AM   Modules accepted: Orders
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
## 2021-05-24 ENCOUNTER — Encounter: Payer: Self-pay | Admitting: Physician Assistant

## 2021-05-24 ENCOUNTER — Ambulatory Visit: Payer: 59 | Admitting: Physician Assistant

## 2021-05-24 ENCOUNTER — Other Ambulatory Visit: Payer: Self-pay

## 2021-05-24 VITALS — BP 155/71 | HR 72 | Ht 70.0 in | Wt 182.8 lb

## 2021-05-24 DIAGNOSIS — G4752 REM sleep behavior disorder: Secondary | ICD-10-CM | POA: Diagnosis not present

## 2021-05-24 DIAGNOSIS — R413 Other amnesia: Secondary | ICD-10-CM

## 2021-05-24 NOTE — Patient Instructions (Signed)
It was a pleasure to see you today at our office.   Recommendations:  Neurocognitive evaluation at our office Keep Sleep Study for evaluation of sleep apnea  Follow up after neurocognitive testing Referral to psychiatry  RECOMMENDATIONS FOR ALL PATIENTS WITH MEMORY PROBLEMS: 1. Continue to exercise (Recommend 30 minutes of walking everyday, or 3 hours every week) 2. Increase social interactions - continue going to [place] and enjoy social gatherings with friends and family 3. Eat healthy, avoid fried foods and eat more fruits and vegetables 4. Maintain adequate blood pressure, blood sugar, and blood cholesterol level. Reducing the risk of stroke and cardiovascular disease also helps promoting better memory. 5. Avoid stressful situations. Live a simple life and avoid aggravations. Organize your time and prepare for the next day in anticipation. 6. Sleep well, avoid any interruptions of sleep and avoid any distractions in the bedroom that may interfere with adequate sleep quality 7. Avoid sugar, avoid sweets as there is a strong link between excessive sugar intake, diabetes, and cognitive impairment We discussed the Mediterranean diet, which has been shown to help patients reduce the risk of progressive memory disorders and reduces cardiovascular risk. This includes eating fish, eat fruits and green leafy vegetables, nuts like almonds and hazelnuts, walnuts, and also use olive oil. Avoid fast foods and fried foods as much as possible. Avoid sweets and sugar as sugar use has been linked to worsening of memory function.  There is always a concern of gradual progression of memory problems. If this is the case, then we may need to adjust level of care according to patient needs. Support, both to the patient and caregiver, should then be put into place.      You have been referred for a neuropsychological evaluation (i.e., evaluation of memory and thinking abilities). Please bring someone with you to  this appointment if possible, as it is helpful for the doctor to hear from both you and another adult who knows you well. Please bring eyeglasses and hearing aids if you wear them.    The evaluation will take approximately 3 hours and has two parts:   The first part is a clinical interview with the neuropsychologist (Dr. Milbert Coulter or Dr. Roseanne Reno). During the interview, the neuropsychologist will speak with you and the individual you brought to the appointment.    The second part of the evaluation is testing with the doctor's technician Annabelle Harman or Selena Batten). During the testing, the technician will ask you to remember different types of material, solve problems, and answer some questionnaires. Your family member will not be present for this portion of the evaluation.   Please note: We must reserve several hours of the neuropsychologist's time and the psychometrician's time for your evaluation appointment. As such, there is a No-Show fee of $100. If you are unable to attend any of your appointments, please contact our office as soon as possible to reschedule.    FALL PRECAUTIONS: Be cautious when walking. Scan the area for obstacles that may increase the risk of trips and falls. When getting up in the mornings, sit up at the edge of the bed for a few minutes before getting out of bed. Consider elevating the bed at the head end to avoid drop of blood pressure when getting up. Walk always in a well-lit room (use night lights in the walls). Avoid area rugs or power cords from appliances in the middle of the walkways. Use a walker or a cane if necessary and consider physical therapy for balance  exercise. Get your eyesight checked regularly.  FINANCIAL OVERSIGHT: Supervision, especially oversight when making financial decisions or transactions is also recommended.  HOME SAFETY: Consider the safety of the kitchen when operating appliances like stoves, microwave oven, and blender. Consider having supervision and share  cooking responsibilities until no longer able to participate in those. Accidents with firearms and other hazards in the house should be identified and addressed as well.   ABILITY TO BE LEFT ALONE: If patient is unable to contact 911 operator, consider using LifeLine, or when the need is there, arrange for someone to stay with patients. Smoking is a fire hazard, consider supervision or cessation. Risk of wandering should be assessed by caregiver and if detected at any point, supervision and safe proof recommendations should be instituted.  MEDICATION SUPERVISION: Inability to self-administer medication needs to be constantly addressed. Implement a mechanism to ensure safe administration of the medications.   DRIVING: Regarding driving, in patients with progressive memory problems, driving will be impaired. We advise to have someone else do the driving if trouble finding directions or if minor accidents are reported. Independent driving assessment is available to determine safety of driving.   If you are interested in the driving assessment, you can contact the following:  [facility] [employer] [facility] in [place] [phone]  [facility] [phone]  [facility] [phone] or [phone]    Mediterranean Diet A Mediterranean diet refers to food and lifestyle choices that are based on the traditions of countries located on the [employer]. This way of eating has been shown to help prevent certain conditions and improve outcomes for people who have chronic diseases, like kidney disease and heart disease. What are tips for following this plan? Lifestyle  Cook and eat meals together with your family, when possible. Drink enough fluid to keep your urine clear or pale yellow. Be physically active every day. This includes: Aerobic exercise like running or swimming. Leisure activities like gardening, walking, or  housework. Get 7-8 hours of sleep each night. If recommended by your health care provider, drink red wine in moderation. This means 1 glass a day for nonpregnant women and 2 glasses a day for men. A glass of wine equals 5 oz (150 mL). Reading food labels  Check the serving size of packaged foods. For foods such as rice and pasta, the serving size refers to the amount of cooked product, not dry. Check the total fat in packaged foods. Avoid foods that have saturated fat or trans fats. Check the ingredients list for added sugars, such as corn syrup. Shopping  At the grocery store, buy most of your food from the areas near the walls of the store. This includes: Fresh fruits and vegetables (produce). Grains, beans, nuts, and seeds. Some of these may be available in unpackaged forms or large amounts (in bulk). Fresh seafood. Poultry and eggs. Low-fat dairy products. Buy whole ingredients instead of prepackaged foods. Buy fresh fruits and vegetables in-season from local farmers markets. Buy frozen fruits and vegetables in resealable bags. If you do not have access to quality fresh seafood, buy precooked frozen shrimp or canned fish, such as tuna, salmon, or sardines. Buy small amounts of raw or cooked vegetables, salads, or olives from the deli or salad bar at your store. Stock your pantry so you always have certain foods on hand, such as olive oil, canned tuna, canned tomatoes, rice, pasta, and beans. Cooking  Cook foods with extra-virgin olive oil instead of using  butter or other vegetable oils. Have meat as a side dish, and have vegetables or grains as your main dish. This means having meat in small portions or adding small amounts of meat to foods like pasta or stew. Use beans or vegetables instead of meat in common dishes like chili or lasagna. Experiment with different cooking methods. Try roasting or broiling vegetables instead of steaming or sauteing them. Add frozen vegetables to soups,  stews, pasta, or rice. Add nuts or seeds for added healthy fat at each meal. You can add these to yogurt, salads, or vegetable dishes. Marinate fish or vegetables using olive oil, lemon juice, garlic, and fresh herbs. Meal planning  Plan to eat 1 vegetarian meal one day each week. Try to work up to 2 vegetarian meals, if possible. Eat seafood 2 or more times a week. Have healthy snacks readily available, such as: Vegetable sticks with hummus. Greek yogurt. Fruit and nut trail mix. Eat balanced meals throughout the week. This includes: Fruit: 2-3 servings a day Vegetables: 4-5 servings a day Low-fat dairy: 2 servings a day Fish, poultry, or lean meat: 1 serving a day Beans and legumes: 2 or more servings a week Nuts and seeds: 1-2 servings a day Whole grains: 6-8 servings a day Extra-virgin olive oil: 3-4 servings a day Limit red meat and sweets to only a few servings a month What are my food choices? Mediterranean diet Recommended Grains: Whole-grain pasta. Brown rice. Bulgar wheat. Polenta. Couscous. Whole-wheat bread. Orpah Cobbatmeal. Quinoa. Vegetables: Artichokes. Beets. Broccoli. Cabbage. Carrots. Eggplant. Green beans. Chard. Kale. Spinach. Onions. Leeks. Peas. Squash. Tomatoes. Peppers. Radishes. Fruits: Apples. Apricots. Avocado. Berries. Bananas. Cherries. Dates. Figs. Grapes. Lemons. Melon. Oranges. Peaches. Plums. Pomegranate. Meats and other protein foods: Beans. Almonds. Sunflower seeds. Pine nuts. Peanuts. Cod. Salmon. Scallops. Shrimp. Tuna. Tilapia. Clams. Oysters. Eggs. Dairy: Low-fat milk. Cheese. Greek yogurt. Beverages: Water. Red wine. Herbal tea. Fats and oils: Extra virgin olive oil. Avocado oil. Grape seed oil. Sweets and desserts: [place] yogurt with honey. Baked apples. Poached pears. Trail mix. Seasoning and other foods: Basil. Cilantro. Coriander. Cumin. Mint. Parsley. Sage. Rosemary. Tarragon. Garlic. Oregano. Thyme. Pepper. Balsalmic vinegar. Tahini. Hummus. Tomato  sauce. Olives. Mushrooms. Limit these Grains: Prepackaged pasta or rice dishes. Prepackaged cereal with added sugar. Vegetables: Deep fried potatoes (french fries). Fruits: Fruit canned in syrup. Meats and other protein foods: Beef. Pork. Lamb. Poultry with skin. Hot dogs. Tomasa BlaseBacon. Dairy: Ice cream. Sour cream. Whole milk. Beverages: Juice. Sugar-sweetened soft drinks. Beer. Liquor and spirits. Fats and oils: Butter. Canola oil. Vegetable oil. Beef fat (tallow). Lard. Sweets and desserts: Cookies. Cakes. Pies. Candy. Seasoning and other foods: Mayonnaise. Premade sauces and marinades. The items listed may not be a complete list. Talk with your dietitian about what dietary choices are right for you. Summary The Mediterranean diet includes both food and lifestyle choices. Eat a variety of fresh fruits and vegetables, beans, nuts, seeds, and whole grains. Limit the amount of red meat and sweets that you eat. Talk with your health care provider about whether it is safe for you to drink red wine in moderation. This means 1 glass a day for nonpregnant women and 2 glasses a day for men. A glass of wine equals 5 oz (150 mL). This information is not intended to replace advice given to you by your health care provider. Make sure you discuss any questions you have with your health care provider. Document Released: 11/15/2015 Document Revised: 12/18/2015 Document Reviewed: 11/15/2015 Elsevier Interactive Patient Education  2017 Elsevier Inc.  We have sent a referral to [facility] for your MRI and they will call you directly to schedule your appointment. They are located at [address] [facility]. If you need to contact them directly please call [phone].   Your provider has requested that you have labwork completed today. Please go to [facility] ([id number]) on the second floor of this building before leaving the office today. You do not need to check in. If you are not called within 15  minutes please check with the front desk.

## 2021-05-26 ENCOUNTER — Encounter (HOSPITAL_BASED_OUTPATIENT_CLINIC_OR_DEPARTMENT_OTHER): Payer: 59 | Admitting: Internal Medicine

## 2021-05-27 ENCOUNTER — Encounter (HOSPITAL_BASED_OUTPATIENT_CLINIC_OR_DEPARTMENT_OTHER): Payer: 59 | Admitting: Internal Medicine

## 2021-05-27 ENCOUNTER — Telehealth: Payer: Self-pay | Admitting: Internal Medicine

## 2021-05-27 DIAGNOSIS — R413 Other amnesia: Secondary | ICD-10-CM | POA: Insufficient documentation

## 2021-05-28 ENCOUNTER — Encounter: Payer: Self-pay | Admitting: Internal Medicine

## 2021-05-28 ENCOUNTER — Ambulatory Visit (HOSPITAL_BASED_OUTPATIENT_CLINIC_OR_DEPARTMENT_OTHER): Payer: 59 | Admitting: Internal Medicine

## 2021-05-28 DIAGNOSIS — G4733 Obstructive sleep apnea (adult) (pediatric): Secondary | ICD-10-CM

## 2021-05-30 NOTE — Telephone Encounter (Signed)
New order for sleep study has been placed with diagnoses of OSA. Nothing further needed at this time.

## 2021-05-31 NOTE — Telephone Encounter (Signed)
New order has been placed with dx of OSA.  I spoke ot pt & gave him appt info and I have given to Reed to get prior [place].  Nothing further needed on this message.

## 2021-06-19 ENCOUNTER — Encounter (HOSPITAL_BASED_OUTPATIENT_CLINIC_OR_DEPARTMENT_OTHER): Payer: 59 | Admitting: Internal Medicine

## 2021-06-19 ENCOUNTER — Telehealth: Payer: Self-pay | Admitting: Internal Medicine

## 2021-06-20 NOTE — Telephone Encounter (Signed)
Will forward to PCCs to look into why pts sleep study was denied again. Thanks.  ?

## 2021-06-27 NOTE — Telephone Encounter (Signed)
Pt also left a message on my vm.  I called him and told him that Jeanice Lim was working on the authorization and she would be getting back to him.  I made him aware she is not in the office today.   ?

## 2021-06-27 NOTE — Telephone Encounter (Signed)
Patient checking on overnight sleep study. Patient phone number is [phone]. ?

## 2021-06-28 NOTE — Telephone Encounter (Signed)
Pt aware that I am working on this.  Nothing further needed at this time.  ?

## 2021-07-23 ENCOUNTER — Telehealth: Payer: Self-pay | Admitting: Physician Assistant

## 2021-07-23 NOTE — Telephone Encounter (Signed)
Patient called and stated that he is having trouble getting a sleep study scheduled that Huntley Dec had prescribed.  He wanted to see if he could get some help getting that scheduled. ?

## 2021-07-23 NOTE — Telephone Encounter (Signed)
Patient was referred to [facility] patient needs to call [phone]  ?

## 2021-07-23 NOTE — Telephone Encounter (Signed)
Patient advised to contact insurance,  ?

## 2021-07-25 NOTE — Telephone Encounter (Signed)
Referral has been placed for sleep study, insurance is denying this. Please advise.  ?

## 2021-07-25 NOTE — Telephone Encounter (Signed)
Patient called back to ask about the sleep study. ?

## 2021-07-26 NOTE — Telephone Encounter (Signed)
Patient is going to take [facility] [place] and his insurance to get more information.thanked me for calling ?

## 2021-08-07 ENCOUNTER — Telehealth: Payer: Self-pay | Admitting: Internal Medicine

## 2021-08-07 NOTE — Telephone Encounter (Signed)
Patient's wife [place] Sink called and expressed quite a bit of frustration about getting the sleep test done before the neuropsych testing 08/27/21. ? ?She said they've been very proactive about reaching out to [facility] but they are not getting calls back even though the test has been authorized. ? ?Okay to call [place] Sink back or the patient directly, that may be better.  ? ?They are requesting some help getting this resolved. ? ? ?

## 2021-08-07 NOTE — Telephone Encounter (Signed)
Received Epic message from Allean Found LPN with Neuro asking for an update on patient's sleep study. Spoke with [facility] who states that she is still working with his insurance to get it approved. Told Christy that info and she asked me to call patient and let him know. I called and spoke with patient who states he is very frustrated with this whole situation and that he called and spoke with his insurance company last week and they told him that they just need something that says OSA and not snoring. He says they told him that snoring is not covered and it needs to say OSA. I sent that in a message to [place]. I expressed understanding to patient and told him we will let him know once we have an update. Nothing further needed at this time. ?

## 2021-08-08 NOTE — Telephone Encounter (Signed)
I spoke with patient and he is going to reach out to [facility]. They tried to contact him to schedule. He thanked me for calling this am and following up  ?

## 2021-08-21 ENCOUNTER — Telehealth: Payer: Self-pay | Admitting: Internal Medicine

## 2021-08-21 DIAGNOSIS — G4733 Obstructive sleep apnea (adult) (pediatric): Secondary | ICD-10-CM

## 2021-08-22 DIAGNOSIS — K5730 Diverticulosis of large intestine without perforation or abscess without bleeding: Secondary | ICD-10-CM | POA: Insufficient documentation

## 2021-08-22 DIAGNOSIS — M5430 Sciatica, unspecified side: Secondary | ICD-10-CM | POA: Insufficient documentation

## 2021-08-22 DIAGNOSIS — N529 Male erectile dysfunction, unspecified: Secondary | ICD-10-CM | POA: Insufficient documentation

## 2021-08-22 DIAGNOSIS — Z86010 Personal history of colonic polyps: Secondary | ICD-10-CM | POA: Insufficient documentation

## 2021-08-22 DIAGNOSIS — E538 Deficiency of other specified B group vitamins: Secondary | ICD-10-CM | POA: Insufficient documentation

## 2021-08-22 DIAGNOSIS — I10 Essential (primary) hypertension: Secondary | ICD-10-CM | POA: Insufficient documentation

## 2021-08-22 NOTE — Telephone Encounter (Signed)
Patient's insurance continues to deny the in lab sleep study for this patient.

## 2021-08-23 ENCOUNTER — Ambulatory Visit: Payer: 59

## 2021-08-23 DIAGNOSIS — G4733 Obstructive sleep apnea (adult) (pediatric): Secondary | ICD-10-CM | POA: Diagnosis not present

## 2021-08-23 NOTE — Telephone Encounter (Signed)
Order for home sleep study has been placed and split night has been canceled.

## 2021-08-23 NOTE — Telephone Encounter (Signed)
Pt aware insurance will NOT cover an in lab study at this time & has agrees to complete a HST.  Pt scheduled today @ 3:30.  Nothing further needed at this time.

## 2021-08-27 ENCOUNTER — Ambulatory Visit (INDEPENDENT_AMBULATORY_CARE_PROVIDER_SITE_OTHER): Payer: 59 | Admitting: Psychology

## 2021-08-27 ENCOUNTER — Ambulatory Visit: Payer: 59

## 2021-08-27 ENCOUNTER — Encounter: Payer: Self-pay | Admitting: Psychology

## 2021-08-27 DIAGNOSIS — G4752 REM sleep behavior disorder: Secondary | ICD-10-CM | POA: Diagnosis not present

## 2021-08-27 DIAGNOSIS — R4189 Other symptoms and signs involving cognitive functions and awareness: Secondary | ICD-10-CM

## 2021-08-27 DIAGNOSIS — F688 Other specified disorders of adult personality and behavior: Secondary | ICD-10-CM | POA: Diagnosis not present
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
## 2021-08-27 NOTE — Progress Notes (Signed)
   Psychometrician Note   Cognitive testing was administered to Christian Landry by Shan Levans, B.S. (psychometrist) under the supervision of Dr. Newman Nickels, Ph.D., licensed psychologist on 08/27/2021. Christian Landry did not appear overtly distressed by the testing session per behavioral observation or responses across self-report questionnaires. Rest breaks were offered.    The battery of tests administered was selected by Dr. Newman Nickels, Ph.D. with consideration to Christian Landry current level of functioning, the nature of his symptoms, emotional and behavioral responses during interview, level of literacy, observed level of motivation/effort, and the nature of the referral question. This battery was communicated to the psychometrist. Communication between Dr. Newman Nickels, Ph.D. and the psychometrist was ongoing throughout the evaluation and Dr. Newman Nickels, Ph.D. was immediately accessible at all times. Dr. Newman Nickels, Ph.D. provided supervision to the psychometrist on the date of this service to the extent necessary to assure the quality of all services provided.    Christian Landry will return within approximately 1-2 weeks for an interactive feedback session with Dr. Milbert Coulter at which time his test performances, clinical impressions, and treatment recommendations will be reviewed in detail. Christian Landry understands he can contact our office should he require our assistance before this time.  A total of 155 minutes of billable time were spent face-to-face with Christian Landry by the psychometrist. This includes both test administration and scoring time. Billing for these services is reflected in the clinical report generated by Dr. Newman Nickels, Ph.D.  This note reflects time spent with the psychometrician and does not include test scores or any clinical interpretations made by Dr. Milbert Coulter. The full report will follow in a separate note.

## 2021-09-04 DIAGNOSIS — G4733 Obstructive sleep apnea (adult) (pediatric): Secondary | ICD-10-CM | POA: Diagnosis not present

## 2021-09-05 ENCOUNTER — Ambulatory Visit (INDEPENDENT_AMBULATORY_CARE_PROVIDER_SITE_OTHER): Payer: 59 | Admitting: Psychology

## 2021-09-05 DIAGNOSIS — G4752 REM sleep behavior disorder: Secondary | ICD-10-CM | POA: Diagnosis not present

## 2021-09-05 DIAGNOSIS — F688 Other specified disorders of adult personality and behavior: Secondary | ICD-10-CM | POA: Diagnosis not present

## 2021-09-05 DIAGNOSIS — R4189 Other symptoms and signs involving cognitive functions and awareness: Secondary | ICD-10-CM | POA: Diagnosis not present

## 2021-09-05 NOTE — Progress Notes (Signed)
   Neuropsychology Feedback Session Christian Landry. [facility] [place] Department of Neurology  Reason for Referral:   Christian Landry is a 66 y.o. right-handed Caucasian male referred by  Marlowe Kays, PA-C , to characterize his current cognitive functioning and assist with diagnostic clarity and treatment planning in the context of short-term memory loss and personality changes reported by his wife.   Feedback:   Christian Landry completed a comprehensive neuropsychological evaluation on 08/27/2021. Please refer to that encounter for the full report and recommendations. Briefly, results suggested neuropsychological functioning within normal limits relative to age-matched peers. Slight performance variability was exhibited across processing speed, executive functioning, and verbal memory (list learning); however, performances were largely adequate among these domains. 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 However, these may be caused by stress or other factors and continued monitoring will be important moving forward.   Christian Landry was accompanied by his wife during the current feedback session. Content of the current session focused on the results of his neuropsychological evaluation. Christian Landry was given the opportunity to ask questions and his questions were answered. He was encouraged to reach out should additional questions arise. A copy of his report was provided at the conclusion of the visit.      30 minutes were spent conducting the current feedback session with Christian Landry, billed as one unit [phone].

## 2021-09-08 ENCOUNTER — Other Ambulatory Visit: Payer: Self-pay | Admitting: Physician Assistant

## 2021-09-16 ENCOUNTER — Ambulatory Visit: Payer: 59 | Admitting: Physician Assistant

## 2021-09-16 ENCOUNTER — Encounter: Payer: Self-pay | Admitting: Physician Assistant

## 2021-09-16 VITALS — BP 118/74 | HR 74 | Resp 18 | Ht 70.0 in | Wt 178.0 lb

## 2021-09-16 DIAGNOSIS — R413 Other amnesia: Secondary | ICD-10-CM | POA: Diagnosis not present

## 2021-09-16 NOTE — Progress Notes (Signed)
Assessment/Plan:   Memory difficulties Christian Landry is a very pleasant 66 y.o. RH male  seen today in follow up for memory loss.  Since his last visit, the patient had a neuropsychological evaluation, yielding within normal limits value . Findings were not not consistent with LBD at the present time.  Memory patterns are not concerning for typically presented early onset AD and recent neuroimaging did not reveal cerebrovascular changes to warrant consideration of a primary vascular cognitive disorder.  The patient denies any acute symptoms of anxiety and depression, or denied any history of psychiatric distress.  All the findings were discussed with the patient and his wife.  They do admit to have marital issues, that they need to be resolved.  In view of no neurological abnormalities, no follow-up by neurology is warranted.  All questions were answered  Follow up as needed   Case discussed with Dr. Everlena Cooper who agrees with the plan  Subjective:    Christian Landry is a very pleasant 66 y.o. RH male  seen today in follow up for memory loss. This patient is accompanied in the office by his wife who supplements the history.  Previous records as well as any outside records available were reviewed prior to todays visit.  Patient was last seen at our office on 05/24/2021 at which time his MoCA was 27/30.   Any changes in memory since last visit? Same "may even be better". Patient lives with: Spouse who reports that since beginning to take Lexapro, he is [place], and he is able to concentrate better repeats oneself?  Denies Disoriented when walking into a room?  Patient denies   Leaving objects in unusual places?  Patient denies   Ambulates  with difficulty?   Patient denies   Recent falls?  Patient denies   Any head injuries?  Patient denies   History of seizures?   Patient denies   Wandering behavior?  Patient denies   Patient drives?   Patient drives without any issues Any mood changes. Patient  denies.  His wife, tearfully reports that he is constantly busy with different things, she shows me his "little black book of appointments ", "and they do not include me "-she says.  They do admit to not having a good communication, and that that needs to be improved.  His wife is interested in couples therapy now that he is less anxious. Any history of depression?:  Patient denies   Hallucinations?  Patient denies   Paranoia?  Patient denies   Patient reports that he sleeps well without vivid dreams, REM behavior or sleepwalking   History of sleep apnea?  Patient denies   Any hygiene concerns?  Patient denies   Independent of bathing and dressing?  Endorsed  Does the patient needs help with medications?  He is in charge of the medications  who is in charge of the finances?  Patient is in charge  Any changes in appetite?  Patient denies   Patient have trouble swallowing? Patient denies   Does the patient cook?  Patient denies   Any kitchen accidents such as leaving the stove on? Patient denies   Any headaches?  Patient denies   The double vision? Patient denies   Any focal numbness or tingling?  Patient denies   Chronic back pain Patient denies   Unilateral weakness?  Patient denies   Any tremors?  Patient denies   Any history of anosmia?  Patient denies   Any incontinence of urine?  Patient denies   Any bowel dysfunction?   Patient denies      Neuropsych evaluation on 08/27/2021 Christian Landry's pattern of performance is suggestive of neuropsychological functioning within normal limits relative to age-matched peers. Slight performance variability was exhibited across processing speed, executive functioning, and verbal memory (list learning); however, performances were largely adequate among these domains. Performances were also adequate across attention/concentration, safety/judgment, receptive and expressive language, visuospatial abilities, and learning and memory. There is the potential for  some below average performances represent a subtle decline from a previously higher level of functioning. However, there is no prior testing available for comparison purposes and these scores may simply represent normal intraindividual variability or longstanding strengths and weaknesses. Christian Landry denied difficulties completing instrumental activities of daily living (ADLs) independently.   Perceptions of his functioning held by himself and his wife (who was not present during the current interview) appear extremely disparate from one another. While he essentially denied all cognitive difficulties, personality changes, behavioral concerns, and REM sleep behaviors, his wife has noted ongoing cognitive decline, described his behavior as erratic, noted ongoing personality changes to the point where she has seemed fearful, and described significant REM sleep behaviors. Given these disputing reports, I am unable to determine which is the more accurate presentation. There certainly remains the potential that Christian Landry attempted to present himself in a favorable light during the current interview and actively denied all concerns. This also appears to be consistent throughout much of his medical records as his wife commonly reports far greater concerns.    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 Behavioral disinhibition and personality changes are the hallmark characteristic of this illness and his age is certainly within the expected range when this illness most commonly presents itself. Cognitive dysfunction can be minimal or even completely absent across objective testing when in earlier stages, meaning that currently benign testing results would not exclude this illness from being present. This illness remains plausible at the present time  and continued medical monitoring will be important moving forward.    The presence of REM sleep behaviors are rare in FTD presentations and more often associated with Lewy body disease. Despite this, Christian Landry current behavioral and cognitive profile is not consistent with Lewy body disease at the present time. Memory patterns are not concerning for typically presenting early-onset Alzheimer's disease and recent neuroimaging did not reveal cerebrovascular changes to warrant consideration of a primary vascular cognitive disorder. He denied acute symptoms of anxiety and depression and denied a lengthy history of significant psychiatric distress throughout his life.     Initial Evaluation 03/11/21 The patient is seen in neurologic consultation at the request of Blair Heys, MD for the evaluation of memory.  The patient is accompanied by his wife who supplements the history. This is a 66 y.o. year old male who has had gradual memory issues for about 2 or 3 years, worse over the last year.  His wife noticed that the symptoms are worse over the last year.  She states that he is overloaded with work, he is participating in [employer], and despite being very organized, over the last year he has been having trouble with organization.  He usually keeps a pocket calendar, but he has been misplacing it, which creates some frustration.  "He has to write it in a piece of paper  otherwise he becomes very frustrated ".  She also reports that he is more confused, cannot complete a task, has trouble finding objects, and becomes "overloaded ".  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  She has a history of anxiety, and this creates increased anxiety in her and in the relationship according to her.  He may go to the basement and "can find something very quickly, but he cannot remember if he lost something in the first place unless he speaks out loud ".  He likes to cook, denies leaving the stove on.   His wife states that he has lost recipes that usually are in the collection of recipes in the pantry.  She states that his response will be "I do not remember that I was looking for them ". He puts his clean laundry in the  basket and rolls  the socks in a specific way but lately he has " randomly tied them in a knot " which was uncharacteristic. He does asked the same questions and repeats the same stories. He continues to drive, and recently, they had not slept for about 2 days due to their son's wedding, which increased stress during that time, and upon turning into the driveway, he lost direction of the car.  As mentioned earlier, he reports "this is not related to confusion, it was a lack of sleep ".  Of note, he reports that goes to sleep at midnight, may wake up between 2 and 3 in the morning, "I only sleep about 3 hours and go to the office at 6:30 AM ".  His wife states that he snores loudly.  He has never been tested for sleep apnea.   He denies a history of depression, but he does report that he is sad  that " this is making her sad".He denies hallucinations or paranoia, no hygiene concerns are noted, he is independent of bathing and dressing.  He does not miss any medication doses, and he is independent on finances, does not forget to pay any bills.  His appetite is normal, denies trouble swallowing.  He ambulates without assistance of cane or walker, denies any recent falls.  Years ago, he had a branch of a tree falling into his forehead, leaving a scar, but he denies that this was deep, he reports that the injury was superficial, no loss of consciousness.Denies headaches, double vision, dizziness, focal numbness or tingling, unilateral weakness or tremors or anosmia. No history of seizures. Denies urine incontinence, retention, constipation or diarrhea.  Denies ETOH or Tobacco. Family History remarkable for both parents with dementia, and one  maternal grand aunt with dementia possibly Alzheimer's    MRI brain 12/21/22Unremarkable non-contrast MRI appearance of the brain for age. No evidence of acute intracranial abnormality.Minimal mucosal thickening within the bilateral ethmoid and maxillary sinuses.Trace fluid within the right mastoid air cells.   PREVIOUS MEDICATIONS:   CURRENT MEDICATIONS:  Outpatient Encounter Medications as of 09/16/2021  Medication Sig   amLODipine (NORVASC) 5 MG tablet amlodipine 5 mg tablet  TAKE 1 TABLET BY MOUTH ONCE DAILY FOR BLOOD PRESSURE   aspirin EC 81 MG tablet Take 81 mg by mouth daily.   aspirin EC 81 MG tablet 1 tablet   cyanocobalamin 1000 MCG tablet Take 1,000 mcg by mouth daily.   escitalopram (LEXAPRO) 10 MG tablet TAKE 1 TABLET(10 MG) BY MOUTH DAILY   lisinopril (ZESTRIL) 20 MG tablet lisinopril 20 mg tablet  TAKE 1 TABLET BY  MOUTH EVERY DAY FOR BLOOD PRESSURE   Multiple Vitamins-Minerals (MULTIVITAMIN ADULT EXTRA C PO) 1 tablet   Omega-3 Fatty Acids (FISH OIL) 1000 MG CAPS Take 1,000 mg by mouth daily.   sildenafil (VIAGRA) 100 MG tablet SMARTSIG:0.5-1 Tablet(s) By Mouth PRN   No facility-administered encounter medications on file as of 09/16/2021.        No data to display            03/11/2021    8:00 AM  Montreal Cognitive Assessment   Visuospatial/ Executive (0/5) 5  Naming (0/3) 3  Attention: Read list of digits (0/2) 2  Attention: Read list of letters (0/1) 1  Attention: Serial 7 subtraction starting at 100 (0/3) 3  Language: Repeat phrase (0/2) 2  Language : Fluency (0/1) 1  Abstraction (0/2) 2  Delayed Recall (0/5) 2  Orientation (0/6) 6  Total 27    Objective:     PHYSICAL EXAMINATION:    VITALS:   Vitals:   09/16/21 1127  BP: 118/74  Pulse: 74  Resp: 18  SpO2: 97%  Weight: 178 lb (80.7 kg)  Height: 5\' 10"  (1.778 m)    GEN:  The patient appears stated age and is in NAD. HEENT:  Normocephalic, atraumatic.   Neurological examination:  General: NAD, well-groomed, appears stated age. Orientation:  The patient is alert. Oriented to person, place and date Cranial nerves: There is good facial symmetry.The speech is fluent and clear. No aphasia or dysarthria. Fund of knowledge is appropriate. Recent and remote memory are impaired. Attention and concentration are reduced.  Able to name objects and repeat phrases.  Hearing is intact to conversational tone.    Sensation: Sensation is intact to light touch throughout Motor: Strength is at least antigravity x4. Tremors: none  DTR's 2/4 in UE/LE     Movement examination: Tone: There is normal tone in the UE/LE Abnormal movements:  no tremor.  No myoclonus.  No asterixis.   Coordination:  There is no decremation with RAM's. Normal finger to nose  Gait and Station: The patient has no difficulty arising out of a deep-seated chair without the use of the hands. The patient's stride length is good.  Gait is cautious and narrow.    Thank you for allowing us the opportunity to participate in the care of this nice patient. Please do not hesitate to contact us for any questions or concerns.   Total time spent on today's visit was 34 minutes dedicated to this patient today, preparing to see patient, examining the patient, ordering tests and/or medications and counseling the patient, documenting clinical information in the EHR or other health record, independently interpreting results and communicating results to the patient/family, discussing treatment and goals, answering patient's questions and coordinating care.  Cc:  Blair HeysEhinger, Robert, MD  Marlowe KaysSara Kaidance Pantoja 09/17/2021 7:21 AM

## 2021-09-16 NOTE — Patient Instructions (Signed)
Recommend couples therapy Follow up on Sleep study  Return as  needed

## 2021-09-26 ENCOUNTER — Encounter: Payer: Self-pay | Admitting: Internal Medicine

## 2021-09-26 NOTE — Telephone Encounter (Signed)
Home sleep test showed severe obstructive sleep apnea, averaging 39.8 apneas/ hour with drops in blood oxygen level.  I recommend new DME (suggest same as wife), new CPAP auto 5-20, mask of choice, humidifier, supplies, airview/ card  He will need return ov in 31-90 days, per insurance regs

## 2021-10-18 ENCOUNTER — Other Ambulatory Visit: Payer: Self-pay

## 2021-10-18 ENCOUNTER — Telehealth: Payer: Self-pay | Admitting: Physician Assistant

## 2021-10-18 NOTE — Telephone Encounter (Signed)
We are no longer following him, needs to have this med filled by PCP, thanks

## 2021-10-18 NOTE — Telephone Encounter (Signed)
Would like a call to discuss a refill. He needs a few (weeks worth) escitalopram 10mg . He is going on a trip and will run out.  [facility] [address].

## 2021-10-30 NOTE — Progress Notes (Unsigned)
04/15/21- 9 yoM never smoker for sleep evaluation courtesy of Dr Jeanmarie Hubert with concern of OSA. Retired Doctor, hospital includes Dementia, HTN, Lumbar Radiculitis,  Epworth score-9 Body weight today- Covid vax-3 Phizer, 1 Moderna Flu vax-had -----Patient and his wife state that he snores, is restless, wakes up 2 times a night to go to bathroom. States that he does not have trouble going to sleep. Wife states that he does gasp sometimes.  Wife has CPAP for OSA and believes strongly that it helps her. I think the greatest concern they have is ongoing evaluation for decreased memory with family history of Alzheimer's. She describes parasomnia activity with restless moving and sleep, some punching at her in dreams, and kicking. 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  He denies any sleep medicines or any history of ENT surgery, heart or lung disease.  Wife emphasizes that he stays extremely busy "strong work ethic", although he is retired.  Always has projects.  He drinks about a liter of Pepsi daily.  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 Needs treatment for OSA- order placed 6/22 for new CPAP and DME same as wife. -----Pt f/u, sleep has been "okay". Approx 5-6hrs per night of sleep Wife uses [facility] and is happy with her CPAP. He is ready to try it. Apparently order never got to DME. Discussed.  ROS-see HPI   + = positive Constitutional:    weight loss, night sweats, fevers, chills, fatigue, lassitude. HEENT:    headaches, difficulty swallowing, tooth/dental problems, sore throat,       sneezing,  itching, ear ache, nasal congestion, post nasal drip, snoring CV:    chest pain, orthopnea, PND, swelling in lower extremities, anasarca,                                   dizziness, palpitations Resp:   shortness of breath with exertion or at rest.                productive cough,   non-productive cough, coughing up of blood.              change in color of mucus.  wheezing.   Skin:    rash or lesions. GI:  No-   heartburn, indigestion, abdominal pain, nausea, vomiting, diarrhea,                 change in bowel habits, loss of appetite GU: dysuria, change in color of urine, no urgency or frequency.   flank pain. MS:   joint pain, stiffness, decreased range of motion, back pain. Neuro-     nothing unusual Psych:  change in mood or affect.  depression or anxiety.   memory loss.  OBJ- Physical Exam General- Alert, Oriented, Affect-appropriate, Distress- none acute, +Lean Skin- rash-none, lesions- none, excoriation- none Lymphadenopathy- none Head- atraumatic            Eyes- Gross vision intact, PERRLA, conjunctivae and secretions clear            Ears- Hearing, canals-normal  Nose- Clear, no-Septal dev, mucus, polyps, erosion, perforation             Throat- Mallampati III-IV , mucosa clear , drainage- none, tonsils- atrophic, + teeth Neck- flexible , trachea midline, no stridor , thyroid nl, carotid no bruit Chest - symmetrical excursion , unlabored           Heart/CV- RRR , no murmur , no gallop  , no rub, nl s1 s2                           - JVD- none , edema- none, stasis changes- none, varices- none           Lung- clear to P&A, wheeze- none, cough- none , dullness-none, rub- none           Chest wall-  Abd-  Br/ Gen/ Rectal- Not done, not indicated Extrem- cyanosis- none, clubbing, none, atrophy- none, strength- nl Neuro- grossly intact to observation

## 2021-10-31 ENCOUNTER — Encounter: Payer: Self-pay | Admitting: Internal Medicine

## 2021-10-31 ENCOUNTER — Ambulatory Visit: Payer: 59 | Admitting: Internal Medicine

## 2021-10-31 VITALS — BP 96/56 | HR 73 | Ht 70.0 in | Wt 186.7 lb

## 2021-10-31 DIAGNOSIS — G4733 Obstructive sleep apnea (adult) (pediatric): Secondary | ICD-10-CM | POA: Diagnosis not present

## 2021-10-31 DIAGNOSIS — G4752 REM sleep behavior disorder: Secondary | ICD-10-CM | POA: Diagnosis not present

## 2021-10-31 NOTE — Assessment & Plan Note (Signed)
Not noted on his sleep study night. Hopefully some of these events are related to arousals by OSA and may improve with CPAP.

## 2021-10-31 NOTE — Patient Instructions (Signed)
Order- new DME [facility] (same as wife) new CPAP auto 5-20, mask of choice, humidifier, supplies, AirView/ card  Please call if we can help

## 2021-10-31 NOTE — Assessment & Plan Note (Signed)
CPAP is the appropriate initial management for him.  Plan- new CPAP auto 5-20

## 2021-11-15 ENCOUNTER — Telehealth: Payer: Self-pay | Admitting: Internal Medicine

## 2021-11-15 NOTE — Telephone Encounter (Signed)
Order was sent on 7/28 to [facility] and confirmation received from [facility] on that date.  I called [facility] today and spoke to [facility].  Judeth Cornfield is on another line but she is going to have her to check status and call me back.

## 2021-11-18 NOTE — Telephone Encounter (Signed)
Called [facility] and spoke to [place] - she states they have order and pt is ready to be scheduled.  He should be getting a call today or tomorrow.  I spoke to pt and made him aware.  Nothing further needed.

## 2021-11-29 ENCOUNTER — Telehealth: Payer: Self-pay | Admitting: Internal Medicine

## 2021-11-29 DIAGNOSIS — G4733 Obstructive sleep apnea (adult) (pediatric): Secondary | ICD-10-CM

## 2021-11-29 NOTE — Telephone Encounter (Signed)
Patient is aware of below message/recommendations.  Order has been placed to [facility]. Nothing further needed.

## 2021-11-29 NOTE — Telephone Encounter (Signed)
Called and spoke with pt who states he has had his CPAP for about a week and states that it is making him feel funny and bloated. Pt wants to know what might be able to be recommended. Dr. Maple Hudson, please advise.

## 2021-11-29 NOTE — Telephone Encounter (Signed)
Please order DME [facility]- reduce auto range to 5-15.  Let us know if this doesn't make him more comfortable.

## 2022-01-05 NOTE — Progress Notes (Deleted)
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 Needs treatment for OSA- order placed 6/22 for new CPAP and DME same as wife. -----Pt f/u, sleep has been "okay". Approx 5-6hrs per night of sleep Wife uses [facility] and is happy with her CPAP. He is ready to try it. Apparently order never got to DME. Discussed.  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 HEENT:    headaches, difficulty swallowing, tooth/dental problems, sore throat,       sneezing, itching, ear ache, nasal congestion, post nasal drip, snoring CV:    chest pain, orthopnea, PND, swelling in lower extremities, anasarca,                                   dizziness, palpitations Resp:   shortness of breath with exertion or at rest.                productive cough,   non-productive cough, coughing up of blood.              change in color of mucus.  wheezing.   Skin:    rash or lesions. GI:  No-   heartburn, indigestion, abdominal pain,  nausea, vomiting, diarrhea,                 change in bowel habits, loss of appetite GU: dysuria, change in color of urine, no urgency or frequency.   flank pain. MS:   joint pain, stiffness, decreased range of motion, back pain. Neuro-     nothing unusual Psych:  change in mood or affect.  depression or anxiety.   memory loss.  OBJ- Physical Exam General- Alert, Oriented, Affect-appropriate, Distress- none acute, +Lean Skin- rash-none, lesions- none, excoriation- none Lymphadenopathy- none Head- atraumatic            Eyes- Gross vision intact, PERRLA, conjunctivae and secretions clear            Ears- Hearing, canals-normal            Nose- Clear, no-Septal dev, mucus, polyps, erosion, perforation             Throat- Mallampati III-IV , mucosa clear , drainage- none, tonsils- atrophic, + teeth Neck- flexible , trachea midline, no stridor , thyroid nl, carotid no bruit Chest - symmetrical excursion , unlabored           Heart/CV- RRR , no murmur , no gallop  , no rub, nl s1 s2                           -  JVD- none , edema- none, stasis changes- none, varices- none           Lung- clear to P&A, wheeze- none, cough- none , dullness-none, rub- none           Chest wall-  Abd-  Br/ Gen/ Rectal- Not done, not indicated Extrem- cyanosis- none, clubbing, none, atrophy- none, strength- nl Neuro- grossly intact to observation

## 2022-01-07 ENCOUNTER — Ambulatory Visit: Payer: 59 | Admitting: Internal Medicine

## 2022-01-23 NOTE — Progress Notes (Deleted)
04/15/21- 76 yoM never smoker for sleep evaluation courtesy of Dr Jeanmarie Hubert with concern of OSA. Retired Doctor, hospital includes Dementia, HTN, Lumbar Radiculitis,  Epworth score-9 Body weight today- Covid vax-3 Phizer, 1 Moderna Flu vax-had -----Patient and his wife state that he snores, is restless, wakes up 2 times a night to go to bathroom. States that he does not have trouble going to sleep. Wife states that he does gasp sometimes.  Wife has CPAP for OSA and believes strongly that it helps her. I think the greatest concern they have is ongoing evaluation for decreased memory with family history of Alzheimer's. She describes parasomnia activity with restless moving and sleep, some punching at her in dreams, and kicking. 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  He denies any sleep medicines or any history of ENT surgery, heart or lung disease.  Wife emphasizes that he stays extremely busy "strong work ethic", although he is retired.  Always has projects.  He drinks about a liter of Pepsi daily.  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 Needs treatment for OSA- order placed 6/22 for new CPAP and DME same as wife. -----Pt f/u, sleep has been "okay". Approx 5-6hrs per night of sleep Wife uses [facility] and is happy with her CPAP. He is ready to try it. Apparently order never got to DME. Discussed.  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 HEENT:    headaches, difficulty swallowing, tooth/dental problems, sore throat,       sneezing, itching, ear ache, nasal congestion, post nasal drip, snoring CV:    chest pain, orthopnea, PND, swelling in lower extremities, anasarca,                                   dizziness, palpitations Resp:   shortness of breath with exertion or at rest.                productive cough,   non-productive cough, coughing up of blood.              change in color of mucus.  wheezing.   Skin:    rash or lesions. GI:  No-   heartburn, indigestion, abdominal pain, nausea, vomiting, diarrhea,                 change in bowel habits, loss of appetite GU: dysuria, change in color of urine, no urgency or frequency.   flank pain. MS:   joint pain, stiffness, decreased range of motion, back pain. Neuro-     nothing unusual Psych:  change in mood or affect.  depression or anxiety.  memory loss.  OBJ- Physical Exam General- Alert, Oriented, Affect-appropriate, Distress- none acute, +Lean Skin- rash-none, lesions- none, excoriation- none Lymphadenopathy- none Head- atraumatic            Eyes- Gross vision intact, PERRLA, conjunctivae and secretions clear            Ears- Hearing, canals-normal            Nose- Clear, no-Septal dev, mucus, polyps, erosion, perforation             Throat- Mallampati III-IV , mucosa clear , drainage- none, tonsils- atrophic, + teeth Neck- flexible , trachea midline, no stridor , thyroid nl, carotid no bruit Chest - symmetrical excursion , unlabored           Heart/CV- RRR , no murmur , no gallop  , no rub, nl s1 s2                           - JVD- none , edema- none, stasis changes- none, varices- none           Lung- clear to P&A, wheeze- none, cough-  none , dullness-none, rub- none           Chest wall-  Abd-  Br/ Gen/ Rectal- Not done, not indicated Extrem- cyanosis- none, clubbing, none, atrophy- none, strength- nl Neuro- grossly intact to observation

## 2022-01-24 ENCOUNTER — Ambulatory Visit: Payer: 59 | Admitting: Internal Medicine

## 2022-02-21 NOTE — Progress Notes (Signed)
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 Needs treatment for OSA- order placed 6/22 for new CPAP and DME same as wife. -----Pt f/u, sleep has been "okay". Approx 5-6hrs per night of sleep Wife uses [facility] and is happy with her CPAP. He is ready to try it. Apparently order never got to DME. Discussed.  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  Very compliant with CPAP.  Download reviewed. Some sense of bloating which may be from aerophagia.  We are going to try reducing the pressure range a little, to 5-12.  ROS-see HPI   + = positive Constitutional:    weight loss, night sweats, fevers, chills, fatigue, lassitude. HEENT:    headaches, difficulty swallowing, tooth/dental problems, sore throat,       sneezing, itching, ear ache, nasal congestion, post nasal drip, snoring CV:    chest pain, orthopnea, PND, swelling in lower extremities, anasarca,                                   dizziness, palpitations Resp:    shortness of breath with exertion or at rest.                productive cough,   non-productive cough, coughing up of blood.              change in color of mucus.  wheezing.   Skin:    rash or lesions. GI:  No-   heartburn, indigestion, abdominal pain, nausea, vomiting, diarrhea,                 change in bowel habits, loss of appetite GU: dysuria, change in color of urine, no urgency or frequency.   flank pain. MS:   joint pain, stiffness, decreased range of motion, back pain. Neuro-     nothing unusual Psych:  change in mood or affect.  depression or anxiety.   memory loss.  OBJ- Physical Exam General- Alert, Oriented, Affect-appropriate, Distress- none acute, +Lean Skin- rash-none, lesions- none, excoriation- none Lymphadenopathy- none Head- atraumatic            Eyes- Gross vision intact, PERRLA, conjunctivae and secretions clear            Ears- Hearing, canals-normal            Nose- Clear, no-Septal dev, mucus, polyps, erosion, perforation             Throat- Mallampati III-IV , mucosa clear , drainage- none, tonsils- atrophic, + teeth Neck- flexible , trachea midline, no  stridor , thyroid nl, carotid no bruit Chest - symmetrical excursion , unlabored           Heart/CV- RRR , no murmur , no gallop  , no rub, nl s1 s2                           - JVD- none , edema- none, stasis changes- none, varices- none           Lung- clear to P&A, wheeze- none, cough- none , dullness-none, rub- none           Chest wall-  Abd-  Br/ Gen/ Rectal- Not done, not indicated Extrem- cyanosis- none, clubbing, none, atrophy- none, strength- nl Neuro- grossly intact to observation

## 2022-02-24 ENCOUNTER — Telehealth: Payer: Self-pay | Admitting: Physician Assistant

## 2022-02-24 ENCOUNTER — Encounter: Payer: Self-pay | Admitting: Internal Medicine

## 2022-02-24 ENCOUNTER — Ambulatory Visit: Payer: 59 | Admitting: Internal Medicine

## 2022-02-24 VITALS — BP 118/70 | HR 61 | Ht 70.0 in | Wt 190.4 lb

## 2022-02-24 DIAGNOSIS — G4752 REM sleep behavior disorder: Secondary | ICD-10-CM

## 2022-02-24 DIAGNOSIS — G4733 Obstructive sleep apnea (adult) (pediatric): Secondary | ICD-10-CM

## 2022-02-24 NOTE — Telephone Encounter (Signed)
Pt called in wanting to see if [facility] would be able to prescribe something for his memory or ADHD?

## 2022-02-24 NOTE — Patient Instructions (Signed)
Order- DME [facility] please change autopap range to 5-12  We suggested trying an otc caffeine tablet occasionally if needed. Naps are fine too, just keep them short- 20-30 minutes or so.

## 2022-03-12 NOTE — Assessment & Plan Note (Signed)
Not decribing significant movement disturbance.  We will continue to watch for that.

## 2022-03-12 NOTE — Assessment & Plan Note (Signed)
Benefits from CPAP with good compliance and control.  Some daytime tiredness reflecting sleep disturbances which we discussed. Plan-try reducing AutoPap range to 5-12.  Try caffeine tablets.

## 2022-04-24 ENCOUNTER — Telehealth: Payer: Self-pay | Admitting: Internal Medicine

## 2022-04-25 NOTE — Telephone Encounter (Signed)
Checked with [facility] who said that [facility] is able to see pt's OV notes in their system.  Sent a community message to [employer], one of our main [place] reps about the phone call we received. Will await a response from him to see if al lis taken care of prior to closing this encounter.

## 2022-07-23 ENCOUNTER — Other Ambulatory Visit: Payer: Self-pay | Admitting: *Deleted

## 2022-07-23 DIAGNOSIS — G4733 Obstructive sleep apnea (adult) (pediatric): Secondary | ICD-10-CM

## 2022-07-30 ENCOUNTER — Other Ambulatory Visit: Payer: Self-pay | Admitting: Family Medicine

## 2022-07-30 ENCOUNTER — Ambulatory Visit
Admission: RE | Admit: 2022-07-30 | Discharge: 2022-07-30 | Disposition: A | Discharge status: Home / Self Care | Payer: 59 | Source: Ambulatory Visit | Attending: Family Medicine | Admitting: Family Medicine

## 2022-07-30 DIAGNOSIS — R062 Wheezing: Secondary | ICD-10-CM

## 2022-10-06 DIAGNOSIS — F4320 Adjustment disorder, unspecified: Secondary | ICD-10-CM | POA: Diagnosis not present

## 2022-10-07 DIAGNOSIS — M791 Myalgia, unspecified site: Secondary | ICD-10-CM | POA: Diagnosis not present

## 2022-10-23 DIAGNOSIS — G4733 Obstructive sleep apnea (adult) (pediatric): Secondary | ICD-10-CM | POA: Diagnosis not present

## 2022-10-27 DIAGNOSIS — E871 Hypo-osmolality and hyponatremia: Secondary | ICD-10-CM | POA: Diagnosis not present

## 2022-10-27 DIAGNOSIS — E7800 Pure hypercholesterolemia, unspecified: Secondary | ICD-10-CM | POA: Diagnosis not present

## 2022-11-01 DIAGNOSIS — G4733 Obstructive sleep apnea (adult) (pediatric): Secondary | ICD-10-CM | POA: Diagnosis not present

## 2022-11-11 DIAGNOSIS — F4320 Adjustment disorder, unspecified: Secondary | ICD-10-CM | POA: Diagnosis not present

## 2022-11-23 DIAGNOSIS — G4733 Obstructive sleep apnea (adult) (pediatric): Secondary | ICD-10-CM | POA: Diagnosis not present

## 2022-11-27 IMAGING — MR MR HEAD W/O CM
10 series · 48 of 48 positions shown · non-contrast
Comparison: None

CLINICAL DATA: Provided history: Memory loss.

EXAM:
MRI HEAD WITHOUT CONTRAST
TECHNIQUE: Multiplanar, multiecho pulse sequences of the brain and surrounding
structures were obtained without intravenous contrast.

[Series 2: T1 · sagittal · 5.0mm · 0.45mm/px · 1 of 21 slices shown]
[im 1/21]
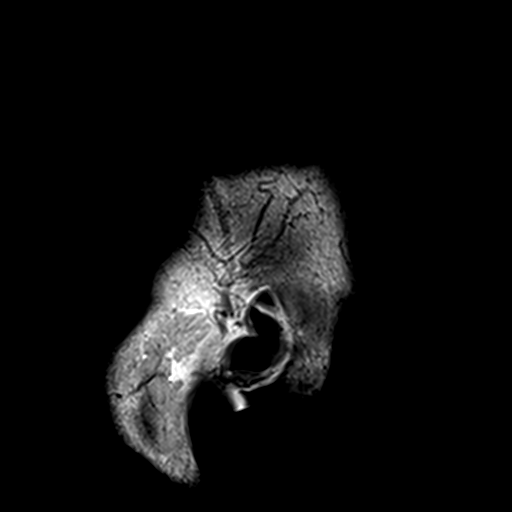

[Series 3: DWI · axial · 3.0mm · 1.80mm/px · z∈[-45,+99]mm · 9 of 100 slices shown (1 of 4)]
[im 1/100]
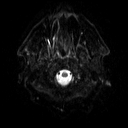
[im 13/100]
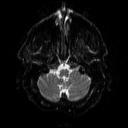
[im 25/100]
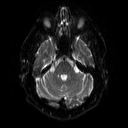
[im 38/100]
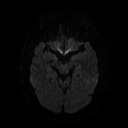
[im 50/100]
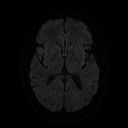
[im 62/100]
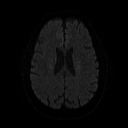
[im 75/100]
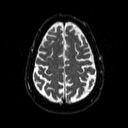
[im 87/100]
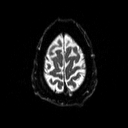
[im 100/100]
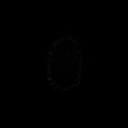

[Series 4: DWI · axial · 3.0mm · 1.80mm/px · z∈[-45,+99]mm · 4 of 49 slices shown (2 of 4)]
[im 1/49]
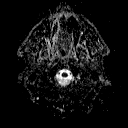
[im 17/49]
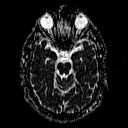
[im 33/49]
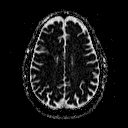
[im 49/49]
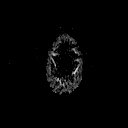

[Series 5: DWI · coronal · 5.0mm · 1.80mm/px · 7 of 72 slices shown (3 of 4)]
[im 1/72]
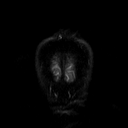
[im 12/72]
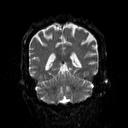
[im 24/72]
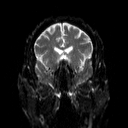
[im 36/72]
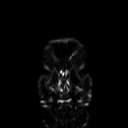
[im 48/72]
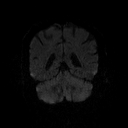
[im 60/72]
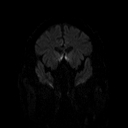
[im 72/72]
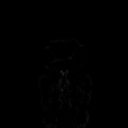

[Series 6: DWI · coronal · 5.0mm · 1.80mm/px · 3 of 36 slices shown (4 of 4)]
[im 1/36]
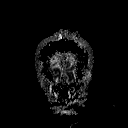
[im 18/36]
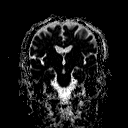
[im 36/36]
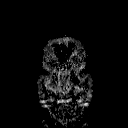

[Series 7: T2 · axial · 5.0mm · 0.51mm/px · z∈[-44,+100]mm · 2 of 22 slices shown (1 of 2)]
[im 1/22]
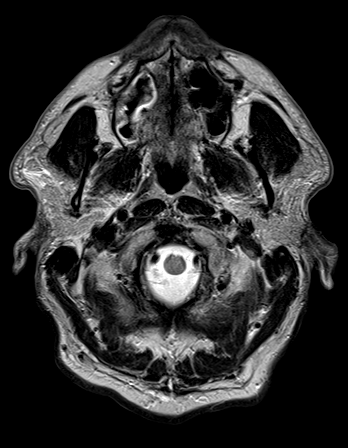
[im 22/22]
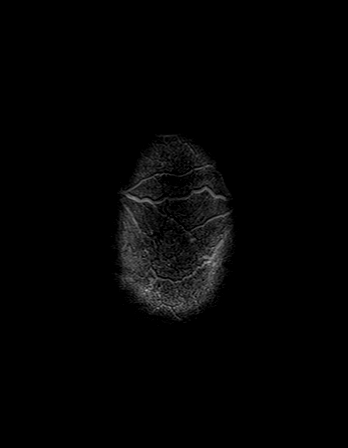

[Series 8: FLAIR · axial · 3.0mm · 0.45mm/px · z∈[-46,+100]mm · 2 of 17 slices shown]
[im 1/17]
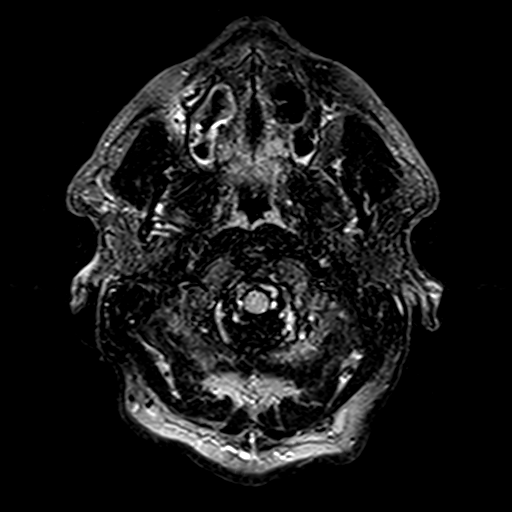
[im 17/17]
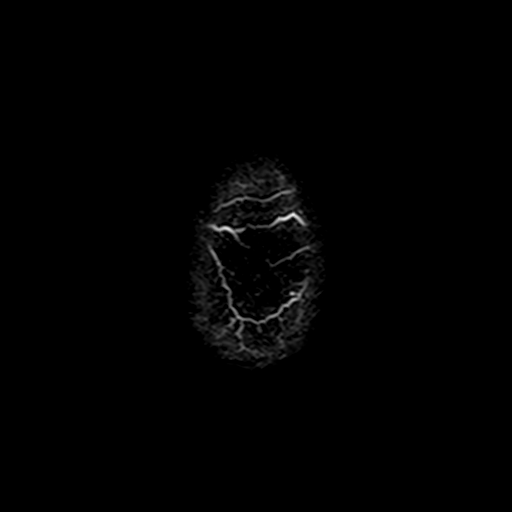

[Series 10: swi_images · axial · 4.0mm · 0.90mm/px · z∈[-49,+104]mm · 4 of 40 slices shown]
[im 1/40]
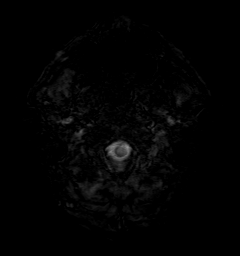
[im 14/40]
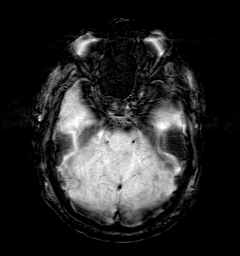
[im 27/40]
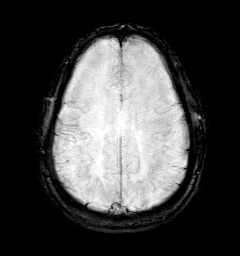
[im 40/40]
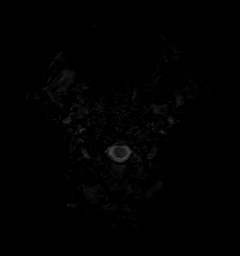

[Series 11: t1_mpr_tra · axial · 1.0mm · 0.71mm/px · z∈[-42,+98]mm · 13 of 144 slices shown]
[im 1/144]
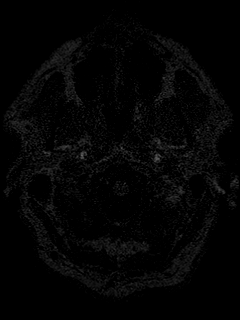
[im 12/144]
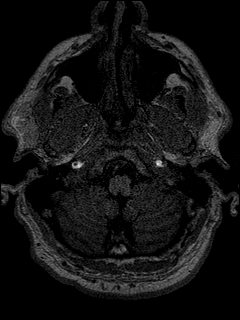
[im 24/144]
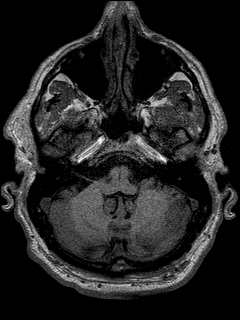
[im 36/144]
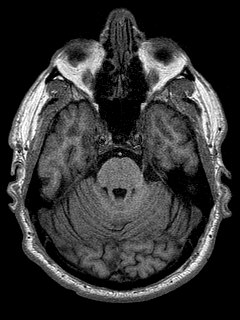
[im 48/144]
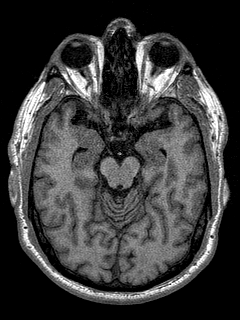
[im 60/144]
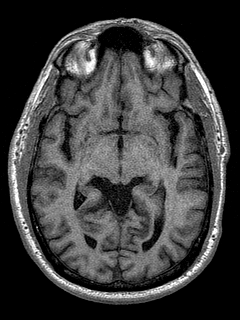
[im 72/144]
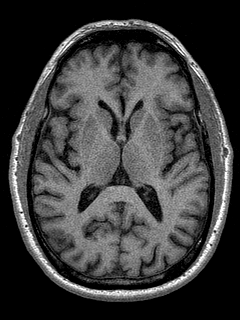
[im 84/144]
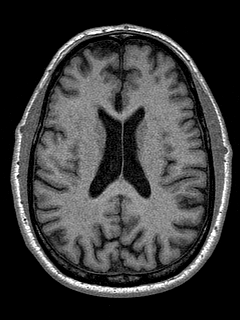
[im 96/144]
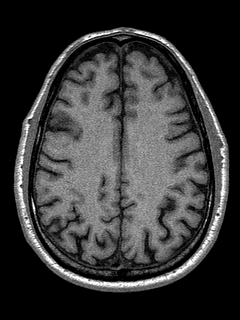
[im 108/144]
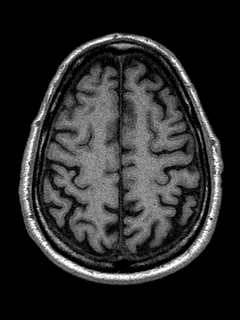
[im 120/144]
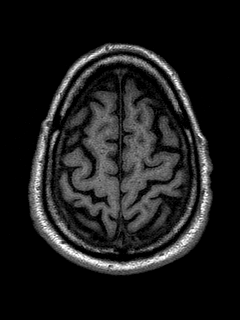
[im 132/144]
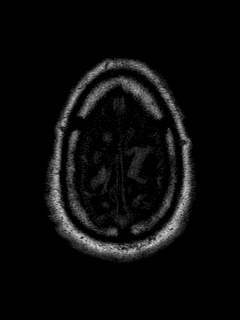
[im 144/144]
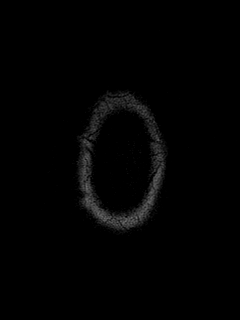

[Series 12: T2 · coronal · 5.0mm · 0.45mm/px · 3 of 28 slices shown (2 of 2)]
[im 1/28]
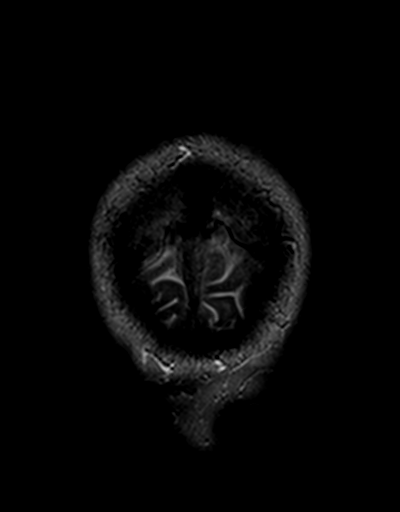
[im 14/28]
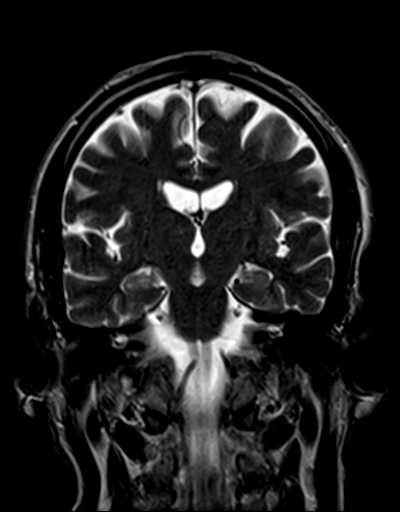
[im 28/28]
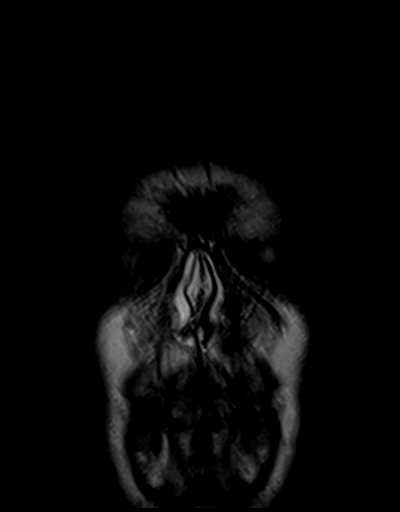

[48 of 48 positions shown; findings below may reference images not displayed]

FINDINGS: Brain:

Mild intermittent motion degradation.

Cerebral volume appears normal for age.

No cortical encephalomalacia is identified. No significant cerebral
white matter disease.

There is no acute infarct.

No evidence of an intracranial mass.

No chronic intracranial blood products.

No extra-axial fluid collection.

No midline shift.

Vascular: Maintained flow voids within the proximal large arterial
vessels.

Skull and upper cervical spine: No focal suspicious marrow lesion.

Sinuses/Orbits: Visualized orbits show no acute finding. Minimal
mucosal thickening within the bilateral ethmoid and maxillary
sinuses.

Other: Trace fluid within the right mastoid air cells.
IMPRESSION: Unremarkable non-contrast MRI appearance of the brain for age. No
evidence of acute intracranial abnormality.

Minimal mucosal thickening within the bilateral ethmoid and
maxillary sinuses.

Trace fluid within the right mastoid air cells.

## 2022-12-02 DIAGNOSIS — F4320 Adjustment disorder, unspecified: Secondary | ICD-10-CM | POA: Diagnosis not present

## 2022-12-15 DIAGNOSIS — Z23 Encounter for immunization: Secondary | ICD-10-CM | POA: Diagnosis not present

## 2022-12-15 DIAGNOSIS — R200 Anesthesia of skin: Secondary | ICD-10-CM | POA: Diagnosis not present

## 2022-12-24 DIAGNOSIS — G4733 Obstructive sleep apnea (adult) (pediatric): Secondary | ICD-10-CM | POA: Diagnosis not present

## 2023-01-05 ENCOUNTER — Encounter: Payer: Self-pay | Admitting: Psychiatry

## 2023-01-05 ENCOUNTER — Ambulatory Visit (INDEPENDENT_AMBULATORY_CARE_PROVIDER_SITE_OTHER): Payer: Medicare HMO | Admitting: Psychiatry

## 2023-01-05 VITALS — BP 136/73 | HR 62 | Wt 171.0 lb

## 2023-01-05 DIAGNOSIS — F4323 Adjustment disorder with mixed anxiety and depressed mood: Secondary | ICD-10-CM | POA: Diagnosis not present

## 2023-01-05 DIAGNOSIS — R413 Other amnesia: Secondary | ICD-10-CM

## 2023-01-05 MED ORDER — DONEPEZIL HCL 5 MG PO TABS
5.0000 mg | ORAL_TABLET | Freq: Every day | ORAL | 1 refills | Status: DC
Start: 2023-01-05 — End: 2023-02-10

## 2023-01-05 MED ORDER — ESCITALOPRAM OXALATE 10 MG PO TABS: 10.0000 mg | ORAL_TABLET | Freq: Every day | ORAL | 1 refills | Status: DC
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
## 2023-01-19 ENCOUNTER — Telehealth: Payer: Self-pay | Admitting: Psychiatry

## 2023-01-19 DIAGNOSIS — Z0100 Encounter for examination of eyes and vision without abnormal findings: Secondary | ICD-10-CM | POA: Diagnosis not present

## 2023-01-19 NOTE — Telephone Encounter (Signed)
Patient has only been seen once and Aricept and Lexapro 10 mg sent in on 9/30. Patient picked up both medications today.

## 2023-01-19 NOTE — Telephone Encounter (Signed)
Christian Landry called saying the pharmacy won't fill both his medications.  They are needing our approval to fill.  He is going on a trip this afternoon and was hoping to pick up his medications. [name] he has been trying to get them filled since his appt .9/30.  Please call the pharmacy to approved the refills. He was going to try to wait at the pharmacy but I told him I didn't when the would be called.  [id number] - [place], [place] - [address]

## 2023-01-19 NOTE — Telephone Encounter (Signed)
LVM RTC in regard to what medications he is wanting refilled.

## 2023-01-22 ENCOUNTER — Telehealth: Payer: Self-pay | Admitting: Psychiatry

## 2023-01-22 NOTE — Telephone Encounter (Signed)
Next appt is 02/02/23. Christian Landry called and states that his Lexapro was reduced by 20 mg to 10 mg  it's not helping. Please call him at [phone]. Pharmacy is:  [id number] [place], [place] - [address] AT [facility] [address] [facility] [address]   Phone: [phone]  Fax: [phone]

## 2023-01-23 DIAGNOSIS — G4733 Obstructive sleep apnea (adult) (pediatric): Secondary | ICD-10-CM | POA: Diagnosis not present

## 2023-01-23 NOTE — Telephone Encounter (Signed)
Called patient and he didn't know why he was on Lexapro. After talking with him and reading what Shanda Bumps had written in her note I told him the decrease was supposed to help with his vivid dreams. He said that now that I mentioned it he was sleeping better and seemed to be getting along better. He had no further needs at this time.

## 2023-01-23 NOTE — Telephone Encounter (Signed)
The Donepezil is the generic name for Aricept. So sounds like he doesn't need any if picked up 09/30.

## 2023-01-28 ENCOUNTER — Telehealth: Payer: Self-pay | Admitting: Internal Medicine

## 2023-01-28 DIAGNOSIS — G4733 Obstructive sleep apnea (adult) (pediatric): Secondary | ICD-10-CM

## 2023-01-28 NOTE — Telephone Encounter (Signed)
Called pt and he was unavailable  Had to Christian Landry

## 2023-01-28 NOTE — Telephone Encounter (Signed)
PT needs an RX for a new CPAP mask. Please order thru [facility].  He can not use CPAP until he gets the new Mask. Same size is OK. States he just needs 2 pieces of the mask:    Padre.... Part B Tube... Part G  Can he get two sets possibly?

## 2023-01-30 NOTE — Telephone Encounter (Signed)
PT is calling again. Please back.  Did the message say it has been approved. I do not see an order in the referral tab.

## 2023-01-30 NOTE — Telephone Encounter (Signed)
Spoke with the pt and notified CPAP mask ordered  Pt aware  Nothing further needed

## 2023-02-02 ENCOUNTER — Ambulatory Visit: Payer: Medicare HMO | Admitting: Psychiatry

## 2023-02-09 ENCOUNTER — Telehealth: Payer: Self-pay | Admitting: Internal Medicine

## 2023-02-09 NOTE — Telephone Encounter (Signed)
Patient states that [facility] will not supply him with his mask and hose until he has an appointment with Dr.Young. He is scheduled for November 11 at 9:00am. Please call and advise if something further is needed.

## 2023-02-10 ENCOUNTER — Ambulatory Visit: Payer: Medicare HMO | Admitting: Psychiatry

## 2023-02-10 ENCOUNTER — Ambulatory Visit (INDEPENDENT_AMBULATORY_CARE_PROVIDER_SITE_OTHER): Payer: Medicare HMO | Admitting: Psychiatry

## 2023-02-10 ENCOUNTER — Encounter: Payer: Self-pay | Admitting: Psychiatry

## 2023-02-10 VITALS — BP 121/64 | HR 73 | Wt 167.0 lb

## 2023-02-10 DIAGNOSIS — F4323 Adjustment disorder with mixed anxiety and depressed mood: Secondary | ICD-10-CM | POA: Diagnosis not present

## 2023-02-10 DIAGNOSIS — G4733 Obstructive sleep apnea (adult) (pediatric): Secondary | ICD-10-CM | POA: Diagnosis not present

## 2023-02-10 DIAGNOSIS — R413 Other amnesia: Secondary | ICD-10-CM

## 2023-02-10 MED ORDER — ESCITALOPRAM OXALATE 10 MG PO TABS: 10.0000 mg | ORAL_TABLET | Freq: Every day | ORAL | 1 refills | Status: DC

## 2023-02-10 MED ORDER — DONEPEZIL HCL 10 MG PO TABS
10.0000 mg | ORAL_TABLET | Freq: Every day | ORAL | 1 refills | Status: DC
Start: 2023-02-10 — End: 2023-03-17

## 2023-02-10 MED ORDER — ARMODAFINIL 150 MG PO TABS
150.0000 mg | ORAL_TABLET | Freq: Every day | ORAL | 1 refills | Status: DC
Start: 2023-02-10 — End: 2023-03-17

## 2023-02-10 NOTE — Progress Notes (Unsigned)
QUILL GRINDER [id number] 05/08/1955 67 y.o.  Subjective:   Patient ID:  Christian Landry is a 67 y.o. (DOB 02-Sep-1955) male.  Chief Complaint:  Chief Complaint  Patient presents with   Memory Loss    HPI Christian Landry presents to the office today for follow-up of memory changes, insomnia, and anxiety. He is accompanied by his wife at his request. She reports that they have been talking some recently. Wife reports 3-4 weks ago they started "making a lot of progress." Wife reports that in the past he wsa having memory issues and he "wasn't accepting that or what I had to say." Wife reports that he has been "different" over the last few weeks since they have started talking. She reports that he has recently been giving her eye contact and her undivided attention.   They have an appointment to see Fredia Sorrow, LMFT for couples' counseling. Wife reports that pt took the initiative to make this appointment. She reports that they saw Karmen Bongo, [facility] for therapy in the past.   Wife reports that she moved out July 15th. She continues to have her own place and they are spending more time together. They have a 49 yo adopted son. Son has 2 children. Wife is a retired Runner, broadcasting/film/video. Wife reports that they have had long-standing marital stress. She reports that she would find white washcloths with red mud stains in the dirty clothes and he would deny this, although no one else was in the home.   She reports that their son has asked them in the past not to argue in front of his children.   Wife reports that she noticed some anger after the deaths of his parents, She reports, "now he seems more fearful."   Wife reports that she started "seeing things" after he was a caregiver for his parents.   Wife reports that he has always been "hyper" and that he would cope with stress by staying busy. 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   He reports, "I am wanting so bad to try to do stuff to make her happy...  I miss her so bad." He reports that he will re-direct his attention when he is not sure what to do next. He denies anxiety- "I don't worry about stuff." He denies depressed mood- "I'm just sad that I can't make my wife happy." He reports that he has been staying up later. Wife reports that he stays on his computer at night. He reports that he has been having an issue with his cPap mask. Wife reports that he has occasionally overslept. He reports, "I feel tired, but I'm always doing." He reports difficulty with concentration and focus. He reports that he has difficulty with multi-tasking. He reports that he is able to stay with a task once he starts it. 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 Wife reports changes in his memory. She reports that he now relies on GPS to get to places. Wife reports that he will not recall some conversations they hae had. He notices difficulty with memory. Weight loss and decreased appetite. Denies SI.   Wife reports that he looks, "sad" and other people have noticed this as well.   He reports vivid dreams over the last few years.   Wife reports that she noticed he was more focused on Armodafinil and is more distracted  off of Armodafinil. Wife reports that he was better able to relax while taking Armodafinil.   Past Psychiatric Medication Trials: Lexapro- denies any change Memantine Armodafinil  Review of Systems:  Review of Systems  Gastrointestinal: Negative.   Musculoskeletal:  Negative for gait problem.  Neurological:  Negative for dizziness and headaches.  Psychiatric/Behavioral:         Please refer to HPI    Medications: I have reviewed the patient's current medications.  Current Outpatient Medications  Medication Sig Dispense Refill   Armodafinil 150 MG tablet Take 1 tablet (150 mg total) by mouth daily. 30 tablet 1   amLODipine (NORVASC) 5 MG tablet amlodipine 5 mg  tablet  TAKE 1 TABLET BY MOUTH ONCE DAILY FOR BLOOD PRESSURE     aspirin EC 81 MG tablet 1 tablet     azelastine (ASTELIN) 0.1 % nasal spray Place 2 sprays into both nostrils 2 (two) times daily.     cyanocobalamin 1000 MCG tablet Take 1,000 mcg by mouth daily.     donepezil (ARICEPT) 10 MG tablet Take 1 tablet (10 mg total) by mouth at bedtime. 30 tablet 1   escitalopram (LEXAPRO) 10 MG tablet Take 1 tablet (10 mg total) by mouth daily. 30 tablet 1   lisinopril (ZESTRIL) 20 MG tablet lisinopril 20 mg tablet  TAKE 1 TABLET BY MOUTH EVERY DAY FOR BLOOD PRESSURE     Multiple Vitamins-Minerals (MULTIVITAMIN ADULT EXTRA C PO) 1 tablet     Omega-3 Fatty Acids (FISH OIL) 1000 MG CAPS Take 1,000 mg by mouth daily.     sildenafil (VIAGRA) 100 MG tablet SMARTSIG:0.5-1 Tablet(s) By Mouth PRN     No current facility-administered medications for this visit.    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   Please see review of systems for further details on the patient's review from today.   Objective:   Physical Exam:  BP 121/64   Pulse 73   Wt 167 lb (75.8 kg)   BMI 23.96 kg/m   Physical Exam Constitutional:      General: He is not in acute distress. Musculoskeletal:        General: No deformity.  Neurological:     Mental Status: He is alert and oriented to person, place, and time.     Coordination: Coordination normal.  Psychiatric:        Attention and Perception: Attention and perception normal. He does not perceive auditory or visual hallucinations.         Mood and Affect: Mood is anxious. Mood is not depressed. Affect is blunt. Affect is not labile, angry or inappropriate.        Speech: Speech normal.        Behavior: Behavior is withdrawn. Behavior is cooperative.        Thought Content: Thought content normal. Thought content is not paranoid or delusional. Thought content does not include homicidal or suicidal ideation. Thought content does not include homicidal or suicidal plan.        Cognition and  Memory: Cognition and memory normal.        Judgment: Judgment normal.     Comments: Insight intact     Lab Review:     Component Value Date/Time   NA 139 03/19/2013 1818   K 3.7 03/19/2013 1818   CL 102 03/19/2013 1818   CO2 27 03/19/2013 1818   GLUCOSE 95 03/19/2013 1818   BUN 12 03/19/2013 1818   CREATININE 1.11 03/19/2013 1818   CALCIUM 9.0 03/19/2013 1818   GFRNONAA 73 (L) 03/19/2013 1818   GFRAA 85 (L) 03/19/2013 1818       Component Value Date/Time   WBC 7.9 03/11/2021 0935   RBC 4.92 03/11/2021 0935   HGB 13.8 03/11/2021 0935   HCT 41.6 03/11/2021 0935   PLT 348.0 03/11/2021 0935   MCV 84.6 03/11/2021 0935   MCH 24.1 (L) 03/19/2013 1818   MCHC 33.2 03/11/2021 0935   RDW 14.1 03/11/2021 0935    No results found for: "POCLITH", "LITHIUM"   No results found for: "PHENYTOIN", "PHENOBARB", "VALPROATE", "CBMZ"   .res Assessment: Plan:    55 minutes spent dedicated to the care of this patient on the date of this encounter to include pre-visit review of records, ordering of medication, post visit documentation, and face-to-face time with the patient and his wife discussing treatment plan and obtaining collateral information and history from pt's wife. Agree with plan to start martial therapy and discussed that both parties are interested in reconciliation and learning how to meet each other's needs.  Pt reports that he is tolerating Aricept 5 mg daily without difficulty. Discussed potential benefits, risks, and side  effects of increasing Aricept to 10 mg daily. Pt is in agreement with dose increase.  Pt's wife reports that he has exhibited long-standing difficulty with concentration and being still, and that these symptoms were improved when he was taking Armodafinil. Pt is in agreement with this and reports worsening concentration, memory, and distractibility since he has not been taking Armodafinil. Will re-start Armodafinil 150 mg daily for OSA and off-label indication of memory changes and difficulty with concentration.  Will continue Lexapro 10 mg daily for mood and anxiety. Pt reports change in sexual function and discussed that this may be a side effect of Lexapro. Agreed to re-evaluate Lexapro and possible alternatives at next visit once response to other med changes is determined.  Pt to follow-up with this provider in 4-6 weeks or sooner if clinically indicated.  Patient advised to contact office with any questions, adverse effects, or acute worsening in signs and symptoms.   Kavion was seen today for memory loss.  Diagnoses and all orders for this visit:  Adjustment disorder with mixed anxiety and depressed mood -     escitalopram (LEXAPRO) 10 MG tablet; Take 1 tablet (10 mg total) by mouth daily.  Memory difficulties -     donepezil (ARICEPT) 10 MG tablet; Take 1 tablet (10 mg total) by mouth at bedtime.  OSA (obstructive sleep apnea) -     Armodafinil 150 MG tablet; Take 1 tablet (150 mg total) by mouth daily.     Please see After Visit Summary for patient specific instructions.  Future Appointments  Date Time Provider Department Center  02/27/2023  9:00 AM Jetty Duhamel D, MD [facility] None  03/09/2023 10:00 AM Deneise Lever, LMFT [facility] None  03/17/2023 12:45 PM Corie Chiquito, PMHNP [facility] None    No orders of the defined types were placed in this encounter.   -------------------------------

## 2023-02-11 ENCOUNTER — Ambulatory Visit: Payer: Medicare HMO | Admitting: Psychiatry

## 2023-02-11 ENCOUNTER — Telehealth: Payer: Self-pay | Admitting: Psychiatry

## 2023-02-11 NOTE — Telephone Encounter (Signed)
We are aware.

## 2023-02-11 NOTE — Telephone Encounter (Signed)
Pt called at 11:17a.  He states that the pharmacy said he needs a PA for the Armodafinil.  He is using [facility] on [address]   Next appt 12/10

## 2023-02-11 NOTE — Telephone Encounter (Signed)
Prior Approval received for Armodafinil 150 mg with [employer] effective through 04/07/2023

## 2023-02-13 ENCOUNTER — Telehealth: Payer: Self-pay | Admitting: Psychiatry

## 2023-02-13 NOTE — Telephone Encounter (Signed)
Armodafinil Rx had been sent but required PA. PA approved. Patient notified.

## 2023-02-13 NOTE — Telephone Encounter (Signed)
Jairen called and LM at 9:34 wanting to know if the new medication has been order.  He said for ADHD.  It was unclear what he was asking.  Please call to clarify with him.

## 2023-02-18 ENCOUNTER — Encounter: Payer: Self-pay | Admitting: Psychiatry

## 2023-02-23 DIAGNOSIS — G4733 Obstructive sleep apnea (adult) (pediatric): Secondary | ICD-10-CM | POA: Diagnosis not present

## 2023-02-26 NOTE — Progress Notes (Signed)
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  Very compliant with CPAP.  Download reviewed. Some sense of bloating which may be from aerophagia.  We are going to try reducing the pressure range a little, to 5-12.  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 [facility] hadn't replaced mal-fitting hose pending this visit. Otherwise compliant and doing ok. Not aware of RBD disturbance.  Attributes 25 lb weight loss to "marital issues". Feels ok.  ROS-see HPI   + = positive Constitutional:    weight loss, night sweats, fevers, chills, fatigue, lassitude. HEENT:    headaches, difficulty swallowing, tooth/dental problems, sore throat,       sneezing, itching, ear ache, nasal congestion, post nasal drip, snoring CV:    chest pain, orthopnea, PND, swelling in lower extremities, anasarca,                                   dizziness, palpitations Resp:   shortness of breath with exertion or at rest.                productive cough,   non-productive cough, coughing up of blood.               change in color of mucus.  wheezing.   Skin:    rash or lesions. GI:  No-   heartburn, indigestion, abdominal pain, nausea, vomiting, diarrhea,                 change in bowel habits, loss of appetite GU: dysuria, change in color of urine, no urgency or frequency.   flank pain. MS:   joint pain, stiffness, decreased range of motion, back pain. Neuro-     nothing unusual Psych:  change in mood or affect.  depression or anxiety.   memory loss.  OBJ- Physical Exam General- Alert, Oriented, Affect-appropriate, Distress- none acute, +Lean Skin- rash-none, lesions- none, excoriation- none Lymphadenopathy- none Head- atraumatic            Eyes- Gross vision intact, PERRLA, conjunctivae and secretions clear            Ears- Hearing, canals-normal            Nose- Clear, no-Septal dev, mucus, polyps, erosion, perforation             Throat- Mallampati III-IV , mucosa clear , drainage- none, tonsils- atrophic, + teeth Neck- flexible , trachea midline, no stridor , thyroid nl, carotid no bruit Chest - symmetrical excursion , unlabored           Heart/CV- RRR , no murmur ,  no gallop  , no rub, nl s1 s2                           - JVD- none , edema- none, stasis changes- none, varices- none           Lung- clear to P&A, wheeze- none, cough- none , dullness-none, rub- none           Chest wall-  Abd-  Br/ Gen/ Rectal- Not done, not indicated Extrem- cyanosis- none, clubbing, none, atrophy- none, strength- nl Neuro- grossly intact to observation

## 2023-02-27 ENCOUNTER — Ambulatory Visit: Payer: Medicare HMO | Admitting: Internal Medicine

## 2023-02-27 ENCOUNTER — Encounter: Payer: Self-pay | Admitting: Internal Medicine

## 2023-02-27 VITALS — BP 118/66 | HR 57 | Ht 70.0 in | Wt 165.9 lb

## 2023-02-27 DIAGNOSIS — G4733 Obstructive sleep apnea (adult) (pediatric): Secondary | ICD-10-CM | POA: Diagnosis not present

## 2023-02-27 DIAGNOSIS — G4752 REM sleep behavior disorder: Secondary | ICD-10-CM

## 2023-02-27 NOTE — Patient Instructions (Signed)
Order- DME [facility]-  please change the autopap range to 5-15, continue supplies, humidifier, and download capability- AirView/ card

## 2023-02-27 NOTE — Assessment & Plan Note (Signed)
He is not aware that if this is still happening.

## 2023-02-27 NOTE — Assessment & Plan Note (Signed)
Needs replacement hose and may need to take his equipment to [facility] to get right size. Does benefit from CPAP,  Plan- increase range to 5-12. Watch effect of weight loss.

## 2023-03-06 DIAGNOSIS — G4733 Obstructive sleep apnea (adult) (pediatric): Secondary | ICD-10-CM | POA: Diagnosis not present

## 2023-03-06 DIAGNOSIS — H43813 Vitreous degeneration, bilateral: Secondary | ICD-10-CM | POA: Diagnosis not present

## 2023-03-09 ENCOUNTER — Ambulatory Visit: Payer: Medicare HMO | Admitting: Behavioral Health

## 2023-03-17 ENCOUNTER — Ambulatory Visit: Payer: Medicare HMO | Admitting: Psychiatry

## 2023-03-17 ENCOUNTER — Encounter: Payer: Self-pay | Admitting: Psychiatry

## 2023-03-17 VITALS — Wt 158.0 lb

## 2023-03-17 DIAGNOSIS — F4323 Adjustment disorder with mixed anxiety and depressed mood: Secondary | ICD-10-CM | POA: Diagnosis not present

## 2023-03-17 DIAGNOSIS — R413 Other amnesia: Secondary | ICD-10-CM

## 2023-03-17 MED ORDER — MIRTAZAPINE 15 MG PO TABS
15.0000 mg | ORAL_TABLET | Freq: Every day | ORAL | 3 refills | Status: DC
Start: 2023-03-17 — End: 2023-04-10

## 2023-03-17 MED ORDER — DONEPEZIL HCL 10 MG PO TABS: 10.0000 mg | ORAL_TABLET | Freq: Every day | ORAL | 1 refills | Status: DC

## 2023-03-17 MED ORDER — ESCITALOPRAM OXALATE 10 MG PO TABS: 10.0000 mg | ORAL_TABLET | Freq: Every day | ORAL | 0 refills | Status: DC

## 2023-03-17 NOTE — Progress Notes (Unsigned)
Christian Landry [id number] 19-Apr-1955 68 y.o.  Subjective:   Patient ID:  Christian Landry is a 67 y.o. (DOB 1955/10/05) male.  Chief Complaint: No chief complaint on file.   HPI Christian Landry presents to the office today for follow-up of memory changes, insomnia, and anxiety.   Christian Landry reports that Christian Landry feels Armodafinil has been helpful. Christian Landry reports, "I was depressed for awhile, but I think that's getting better." Christian Landry reports feeling more hopeful. Christian Landry reports, "I still stop on the way to do something" and will have to think about what Christian Landry was about to do. Christian Landry reports that sometimes Christian Landry loses his train of thought and other times "it won't come back to me." Christian Landry reports that Christian Landry gets tired at the end of the day. Motivation is good and continues to work on various projects. Christian Landry reports that Christian Landry occasionally enjoys watching a move or TV shows. Christian Landry enjoys jigsaw puzzles and building trains. Christian Landry reports that Christian Landry rarely eats breakfast. Christian Landry denies change in appetite. His wife reports that Christian Landry "doesn't slow down to eat." Denies SI- "I have too much to live for."   Wife reports that Christian Landry has started back on cPap. She reports that Christian Landry has been staying up later and sleeping later (up until 1-2 am and sleeping until about 10 am). Christian Landry reports that sleep cycle has not changed since starting Armodafinil. Wife reports that Christian Landry continues to have perfectionistic tendencies. Pt agrees that Christian Landry will sometimes stay up to complete a task or get to a stopping point. Christian Landry continues to do a lot of things for his church.   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   Christian Landry and wife have appointment with marital counselor on 03/30/23.   Past Psychiatric Medication Trials: Lexapro- denies any change Memantine Armodafinil    Review of Systems:  Review of Systems  Medications: I have reviewed the patient's current medications.  Current Outpatient Medications  Medication Sig Dispense Refill   amLODipine (NORVASC) 5 MG tablet  amlodipine 5 mg tablet  TAKE 1 TABLET BY MOUTH ONCE DAILY FOR BLOOD PRESSURE     Armodafinil 150 MG tablet Take 1 tablet (150 mg total) by mouth daily. 30 tablet 1   aspirin EC 81 MG tablet 1 tablet     cyanocobalamin 1000 MCG tablet Take 1,000 mcg by mouth daily.     donepezil (ARICEPT) 10 MG tablet Take 1 tablet (10 mg total) by mouth at bedtime. 30 tablet 1   escitalopram (LEXAPRO) 10 MG tablet Take 1 tablet (10 mg total) by mouth daily. 30 tablet 1   lisinopril (ZESTRIL) 20 MG tablet lisinopril 20 mg tablet  TAKE 1 TABLET BY MOUTH EVERY DAY FOR BLOOD PRESSURE     Multiple Vitamins-Minerals (MULTIVITAMIN ADULT EXTRA C PO) 1 tablet     Omega-3 Fatty Acids (FISH OIL) 1000 MG CAPS Take 1,000 mg by mouth daily.     sildenafil (VIAGRA) 100 MG tablet SMARTSIG:0.5-1 Tablet(s) By Mouth PRN     azelastine (ASTELIN) 0.1 % nasal spray Place 2 sprays into both nostrils 2 (two) times daily. (Patient not taking: Reported on 03/17/2023)     No current facility-administered medications for this visit.    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   Please see review of systems for further details on the patient's review from today.   Objective:   Physical Exam:  Wt 158 lb (71.7 kg)   BMI 22.67 kg/m   Physical Exam  Lab Review:     Component Value Date/Time   NA 139 03/19/2013 1818   K 3.7 03/19/2013 1818   CL 102 03/19/2013 1818   CO2 27 03/19/2013 1818   GLUCOSE 95 03/19/2013 1818   BUN 12 03/19/2013 1818   CREATININE 1.11 03/19/2013 1818   CALCIUM 9.0 03/19/2013 1818    GFRNONAA 73 (L) 03/19/2013 1818   GFRAA 85 (L) 03/19/2013 1818       Component Value Date/Time   WBC 7.9 03/11/2021 0935   RBC 4.92 03/11/2021 0935   HGB 13.8 03/11/2021 0935   HCT 41.6 03/11/2021 0935   PLT 348.0 03/11/2021 0935   MCV 84.6 03/11/2021 0935   MCH 24.1 (L) 03/19/2013 1818   MCHC 33.2 03/11/2021 0935   RDW 14.1 03/11/2021 0935    No results found for: "POCLITH", "LITHIUM"   No results found for: "PHENYTOIN", "PHENOBARB", "VALPROATE", "CBMZ"   .res Assessment: Plan:    There are no diagnoses linked to this encounter.   Please see After Visit Summary for patient specific instructions.  Future Appointments  Date Time Provider Department Center  03/30/2023 10:00 AM Deneise Lever, LMFT [facility] None  04/13/2023 10:00 AM Deneise Lever, LMFT [facility] None  02/29/2024  9:30 AM Jetty Duhamel D, MD [facility] None    No orders of the defined types were placed in this encounter.   -------------------------------

## 2023-03-25 DIAGNOSIS — G4733 Obstructive sleep apnea (adult) (pediatric): Secondary | ICD-10-CM | POA: Diagnosis not present

## 2023-03-30 ENCOUNTER — Ambulatory Visit: Payer: Medicare HMO | Admitting: Behavioral Health

## 2023-03-30 DIAGNOSIS — F4323 Adjustment disorder with mixed anxiety and depressed mood: Secondary | ICD-10-CM | POA: Diagnosis not present

## 2023-03-30 DIAGNOSIS — R413 Other amnesia: Secondary | ICD-10-CM | POA: Diagnosis not present

## 2023-03-30 NOTE — Progress Notes (Signed)
                Christian Jewell L Farryn Linares, LMFT 

## 2023-03-30 NOTE — Progress Notes (Signed)
[facility] Counselor Initial Adult Exam  Name: Christian Landry Date: 03/30/2023 MRN: [id number] DOB: 12-Sep-1955 PCP: Blair Heys, MD (Inactive)  Time spent: 60 min In Person @ [facility] Office Time In: 10:00am Time Out: 11:00am  Guardian/Payee:  [employer] Medicare HMO/PPO    Paperwork requested: No   Reason for Visit /Presenting Problem: Marital & Family conflict intensified in the past few years & some consequences of COVID-19 in the aftermath  Mental Status Exam: Appearance:   Casual and Neat; Wife dressed for Holidays   Behavior:  Appropriate and Sharing on Cpl's part  Motor:  Normal for both  Speech/Language:   Clear and Coherent for both  Affect:  Appropriate; Wife tearful  Mood:  normal; Wife upset & anxious  Thought process:  goal directed for both  Thought content:    WNL  Sensory/Perceptual disturbances:    WNL  Orientation:  oriented to person, place, time/date, and situation  Attention:  Good  Concentration:  Good  Memory:  Pt sts he has undefined memory issues; Wife WNL  Fund of knowledge:   Good  Insight:    Good  Judgment:   Good  Impulse Control:  Good    Risk Assessment: Danger to Self:  No Self-injurious Behavior: No Danger to Others: No Duty to Warn:no Physical Aggression / Violence:No  Access to Firearms a concern: No  Gang Involvement:No  Patient / guardian was educated about steps to take if suicide or homicide risk level increases between visits: yes; appropriate to ICD process While future psychiatric events cannot be accurately predicted, the patient does not currently require acute inpatient psychiatric care and does not currently meet [facility] involuntary commitment criteria.  Substance Abuse History: Current substance abuse: No     Past Psychiatric History:   No previous psychological problems have been observed; Wife has been in psychotherapy for over 12 yrs; Cpl comes on Referral from Fenton Malling,  [facility] Outpatient Providers: Blair Heys, MD (PCP) & Marlowe Kays, PA-C (Neuro) History of Psych Hospitalization: No ; Wife reports she was Admitted to [facility] Floor  & then [facility] for a week & placed on strong medications in 2020. Psychological Testing: Pt has undergone Neuro Eval in the past few yrs. I will review this Report prior to our next Cpl session in Feb 2025.  Abuse History:  Victim of: No.,  NA    Report needed: No. Victim of Neglect:No. Perpetrator of  NA   Witness / Exposure to Domestic Violence: No   Protective Services Involvement: No  Witness to [employer] Violence:  No   Family History:  Family History  Problem Relation Age of Onset   Dementia Mother    Memory loss Mother    Dementia Father    Memory loss Father    Dementia Maternal Grandmother     Living situation: the patient lives with their spouse  Sexual Orientation: Straight  Relationship Status: married  Name of spouse / other:Wife Seymour Sink in attendance today If a parent, number of children / ages:One adopted Son Madelaine Bhat who is married w/2 children to [place]. Madelaine Bhat is early thirties. Their 2 children are 3yo [employer] & [address]. They live in a Starter Home ~ 2 blocks away in the same neighborhood.  Support Systems: spouse, friends, larger extended Family members  Financial Stress:  No   Income/Employment/Disability: Dance movement psychotherapist and Occupational psychologist Service: No   Educational History: Education: some college  Religion/Sprituality/World View: [employer]  Any cultural differences that may affect / interfere with treatment:  Pt may want a person who is [facility] in orientation. We will explore this more for them both individually.   Recreation/Hobbies: Cpl does a lot of activities tgthr  Stressors: Health problems   Marital or family conflict   Pt is struggling to understand & meet his Wife's needs; she moved out of the marital home in July 2024.  Strengths:  Supportive Relationships, Family, Friends, Church, Spirituality, Hopefulness, Self Advocate, Able to Communicate Effectively, and Pt wants Wife to be happy.  Barriers:  None noted today except to clarify spiritual/religious needs of ea Partner   Legal History: Pending legal issue / charges: The patient has no significant history of legal issues. 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  Medications: Current Outpatient Medications  Medication Sig Dispense Refill   amLODipine (NORVASC) 5 MG tablet amlodipine 5 mg tablet  TAKE 1 TABLET BY MOUTH ONCE DAILY FOR BLOOD PRESSURE     Apoaequorin (PREVAGEN PO) Take by mouth.     aspirin EC 81 MG tablet 1 tablet     azelastine (ASTELIN) 0.1 % nasal spray Place 2 sprays into both nostrils 2 (two) times daily. (Patient not taking: Reported on 03/17/2023)     cyanocobalamin 1000 MCG tablet Take 1,000 mcg by mouth daily.     donepezil (ARICEPT) 10 MG tablet Take 1 tablet (10 mg total) by mouth at bedtime. 90 tablet 1   escitalopram (LEXAPRO) 10 MG tablet Take 1 tablet (10 mg total) by mouth daily. 90 tablet 0   ferrous sulfate 324 MG TBEC Take 324 mg by mouth.     lisinopril (ZESTRIL) 20 MG tablet lisinopril 20 mg tablet  TAKE 1 TABLET BY MOUTH EVERY DAY FOR BLOOD PRESSURE     mirtazapine (REMERON) 15 MG tablet Take 1 tablet (15 mg total) by mouth at bedtime. 30 tablet 3   Multiple Vitamins-Minerals (MULTIVITAMIN ADULT EXTRA C PO) 1 tablet     Omega-3 Fatty Acids (FISH OIL) 1000 MG CAPS Take 1,000 mg by mouth daily.     sildenafil (VIAGRA) 100 MG tablet SMARTSIG:0.5-1 Tablet(s) By Mouth PRN     No  current facility-administered medications for this visit.    No Known Allergies  Diagnoses:  Adjustment disorder with mixed anxiety and depressed mood  Memory difficulties  Plan of Care: Burt Knack will attend all sessions every 2-3 wks as scheduled & follow our plan for 3-4 mos, depending on needs as we discover them in the next few visits. Wills wants to understand what psychotherapy is all about-Wife Stanwood Sink c/o him resisting Cslg for 43 yrs now & she is frustrated due to his lack of caring for her in this way. We will give ea Partner an indiv session over the Holidays & join back tgthr after these sep visits are completed to make a comprehensive Tx Plan. Both Partners agree to this plan & being consistent.  Target Date: 05/07/2023  Progress: 4  Frequency: Once every 2-3 wks  Modality: Cpl Th     Deneise Lever,  LMFT

## 2023-04-10 ENCOUNTER — Telehealth (INDEPENDENT_AMBULATORY_CARE_PROVIDER_SITE_OTHER): Payer: Medicare HMO | Admitting: Psychiatry

## 2023-04-10 ENCOUNTER — Encounter: Payer: Self-pay | Admitting: Psychiatry

## 2023-04-10 DIAGNOSIS — F4323 Adjustment disorder with mixed anxiety and depressed mood: Secondary | ICD-10-CM | POA: Diagnosis not present

## 2023-04-10 MED ORDER — ESCITALOPRAM OXALATE 10 MG PO TABS
10.0000 mg | ORAL_TABLET | Freq: Every day | ORAL | 0 refills | Status: AC
Start: 2023-04-10 — End: ?

## 2023-04-10 MED ORDER — MIRTAZAPINE 15 MG PO TABS
15.0000 mg | ORAL_TABLET | Freq: Every day | ORAL | 1 refills | Status: AC
Start: 2023-04-10 — End: ?

## 2023-04-10 NOTE — Progress Notes (Signed)
 Christian Landry [id number] Oct 14, 1955 68 y.o.  Virtual Visit via Video Note  I connected with pt @ on 04/10/23 at  8:30 AM EST by a video enabled telemedicine application and verified that I am speaking with the correct person using two identifiers.   I discussed the limitations of evaluation and management by telemedicine and the availability of in person appointments. The patient expressed understanding and agreed to proceed.  I discussed the assessment and treatment plan with the patient. The patient was provided an opportunity to ask questions and all were answered. The patient agreed with the plan and demonstrated an understanding of the instructions.   The patient was advised to call back or seek an in-person evaluation if the symptoms worsen or if the condition fails to improve as anticipated.  I provided 55 minutes of non-face-to-face time during this encounter.  The patient was located at home.  The provider was located at home.   Harlene Pepper, PMHNP   Subjective:   Patient ID:  Christian Landry is a 68 y.o. (DOB 11-Sep-1955) male.  Chief Complaint:  Chief Complaint  Patient presents with   Follow-up    Anxiety, decreased appetite, and sleep disturbance    HPI Christian Landry presents for follow-up of memory changes, anxiety, and insomnia. He reports that he is feeling, good overall... good days and bad days. He reports that he and his wife have been spending more time together with family over the holidays. He reports wife comes and goes out of their house. He reports that his relationship with wife is hit and miss. He reports, I get sad when she gets really frustrated with me. He reports that his mood is great when we're doing stuff together. He reports that he tries to stay busy since this is helpful for his mood. Denies persistent depression- I'm not doom or gloom. He reports, she's always on my mind. He denies excessive anxiety and reports, I don't worry about  everything. He reports he will think about their relationship and then do things to distract himself. He denies irritability, but I do get frustrated at times. He reports that sometimes he will be quiet when he is frustrated because he does not want to respond in a way that may lead to conflict. He describes good energy and motivation. He reports, overall I've got hope. He reports that he has been staying up later since marital issues began. He reports that he has been sleeping well and estimates sleeping 7-8 hours on average. He reports one night he was awake until 4 am. He reports that he has lost 25 lbs since marital stress began. He reports that he did not eat yesterday. He reports that he is less likely to eat when wife is not there. He reports that he has snacking more over the holidays. He reports adequate concentration. He reports that his memory is sporadic. He reports that he may briefly forget something and recall it later. Denies SI- that's never been an option.   He reports that he cannot see a significant change with medication.   Enjoys working dance movement psychotherapist puzzles.   He and his wife are planning on going on a cruise in February with several other couples.    Past Psychiatric Medication Trials: Lexapro - denies any change Mirtazapine  Memantine Armodafinil     Review of Systems:  Review of Systems  Musculoskeletal:  Negative for gait problem.  Neurological:  Negative for tremors.  Psychiatric/Behavioral:  Please refer to HPI    Medications: I have reviewed the patient's current medications.  Current Outpatient Medications  Medication Sig Dispense Refill   donepezil  (ARICEPT ) 10 MG tablet Take 1 tablet (10 mg total) by mouth at bedtime. 90 tablet 1   sildenafil (VIAGRA) 100 MG tablet SMARTSIG:0.5-1 Tablet(s) By Mouth PRN     amLODipine (NORVASC) 5 MG tablet amlodipine 5 mg tablet  TAKE 1 TABLET BY MOUTH ONCE DAILY FOR BLOOD PRESSURE     Apoaequorin (PREVAGEN PO)  Take by mouth.     aspirin EC 81 MG tablet 1 tablet     azelastine (ASTELIN) 0.1 % nasal spray Place 2 sprays into both nostrils 2 (two) times daily. (Patient not taking: Reported on 03/17/2023)     cyanocobalamin  1000 MCG tablet Take 1,000 mcg by mouth daily.     escitalopram  (LEXAPRO ) 10 MG tablet Take 1 tablet (10 mg total) by mouth daily. 90 tablet 0   ferrous sulfate 324 MG TBEC Take 324 mg by mouth.     lisinopril (ZESTRIL) 20 MG tablet lisinopril 20 mg tablet  TAKE 1 TABLET BY MOUTH EVERY DAY FOR BLOOD PRESSURE     mirtazapine  (REMERON ) 15 MG tablet Take 1 tablet (15 mg total) by mouth at bedtime. 90 tablet 1   Multiple Vitamins-Minerals (MULTIVITAMIN ADULT EXTRA C PO) 1 tablet     Omega-3 Fatty Acids (FISH OIL) 1000 MG CAPS Take 1,000 mg by mouth daily.     No current facility-administered medications for this visit.    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   Sciatica    Sleep apnea    Snoring 04/15/2021   Vitamin B12 deficiency     Family History  Problem Relation Age of Onset   Dementia Mother    Memory loss Mother    Dementia Father    Memory loss Father    Dementia Maternal Grandmother     Social History   Socioeconomic History   Marital status: Married    Spouse name: Not on file   Number of children: 1   Years of education: 14   Highest education level: Associate degree: academic program  Occupational History   Occupation: Retired    Comment: Warden/ranger; part-time psychologist, sport and exercise  Tobacco Use   Smoking status: Never   Smokeless tobacco: Never  Advertising Account Planner   Vaping status: Never Used  Substance and Sexual Activity   Alcohol use: No   Drug use: No   Sexual activity: Yes  Other Topics Concern   Not on file  Social History  Narrative   Right handed   Drinks caffeine   Two story home   Social Drivers of Health   Financial Resource Strain: Not on file  Food Insecurity: Not on file  Transportation Needs: Not on file  Physical Activity: Not on file  Stress: Not on file  Social Connections: Not on file  Intimate Partner Violence: Not on file    Past Medical History, Surgical history, Social history, and Family history were reviewed and updated as appropriate.   Please see review of systems for further details on the patient's review from today.   Objective:   Physical Exam:  Wt 166 lb (75.3 kg)   BMI 23.82 kg/m  Physical Exam Neurological:     Mental Status: He is alert and oriented to person, place, and time.     Cranial Nerves: No dysarthria.  Psychiatric:        Attention and Perception: Attention and perception normal.        Mood and Affect: Mood normal.        Speech: Speech normal.        Behavior: Behavior is cooperative.        Thought Content: Thought content normal. Thought content is not paranoid or delusional. Thought content does not include homicidal or suicidal ideation. Thought content does not include homicidal or suicidal plan.        Cognition and Memory: Cognition and memory normal.        Judgment: Judgment normal.     Comments: Insight intact     Lab Review:     Component Value Date/Time   NA 139 03/19/2013 1818   K 3.7 03/19/2013 1818   CL 102 03/19/2013 1818   CO2 27 03/19/2013 1818   GLUCOSE 95 03/19/2013 1818   BUN 12 03/19/2013 1818   CREATININE 1.11 03/19/2013 1818   CALCIUM 9.0 03/19/2013 1818   GFRNONAA 73 (L) 03/19/2013 1818   GFRAA 85 (L) 03/19/2013 1818       Component Value Date/Time   WBC 7.9 03/11/2021 0935   RBC 4.92 03/11/2021 0935   HGB 13.8 03/11/2021 0935   HCT 41.6 03/11/2021 0935   PLT 348.0 03/11/2021 0935   MCV 84.6 03/11/2021 0935   MCH 24.1 (L) 03/19/2013 1818   MCHC 33.2 03/11/2021 0935   RDW 14.1 03/11/2021 0935    No  results found for: POCLITH, LITHIUM   No results found for: PHENYTOIN, PHENOBARB, VALPROATE, CBMZ   .res Assessment: Plan:   58 minutes spent dedicated to the care of this patient on the date of this encounter to include pre-visit review of records, ordering of medication, post visit documentation, and face-to-face time with the patient discussing response to Mirtazapine . Discussed that he is currently denying any persistent mood or anxiety symptoms. He reports that his weight has increased from last visit and describes improved sleep since starting Remeron . Recommend continuing Remeron  15 mg at bedtime for mood, appetite, and insomnia.  Recommend continuing Lexapro  10 mg daily for mood and anxiety symptoms. Continue Donepezil  10 mg at bedtime.  Recommended continuing marital therapy with Victoria Winstead, LMFT.  Pt to follow-up in 2-3 months or sooner if clinically indicated.  Patient advised to contact office with any questions, adverse effects, or acute worsening in signs and symptoms.   Skyelar was seen today for follow-up.  Diagnoses and all orders for this visit:  Adjustment disorder with mixed anxiety and depressed mood -     escitalopram  (LEXAPRO ) 10 MG tablet; Take 1 tablet (10 mg total) by mouth daily. -     mirtazapine  (REMERON ) 15 MG tablet; Take 1 tablet (15 mg total) by mouth at bedtime.     Please see After Visit Summary for patient specific instructions.  Future Appointments  Date Time Provider Department Center  04/13/2023 10:00 AM Hollace Richerd CROME, LMFT [facility] None  04/27/2023  9:00 AM Hollace Richerd CROME, LMFT [facility] None  02/29/2024  9:30 AM Neysa Rama D, MD [facility] None    No orders of the defined types were placed in this encounter.     -------------------------------

## 2023-04-13 ENCOUNTER — Ambulatory Visit: Payer: Medicare HMO | Admitting: Behavioral Health

## 2023-04-13 DIAGNOSIS — R413 Other amnesia: Secondary | ICD-10-CM

## 2023-04-13 DIAGNOSIS — F4323 Adjustment disorder with mixed anxiety and depressed mood: Secondary | ICD-10-CM | POA: Diagnosis not present

## 2023-04-13 NOTE — Progress Notes (Signed)
   Christian Lever, LMFT

## 2023-04-13 NOTE — Progress Notes (Addendum)
 [facility] Counselor/Therapist Progress Note  Patient ID: Christian Landry, MRN: [id number],    Date: 04/13/2023  Time Spent: 55 min In Person @ [facility] Office Time In: 10:00am Time Out: 10:55am   Treatment Type: Individual Therapy w/Pt only  Reported Symptoms: Elevated anx/dep & stress due to concerns for his Wife & their relationship  Mental Status Exam: Appearance:  Casual     Behavior: Appropriate and Sharing  Motor: Normal  Speech/Language:  Slow and low volume  Affect: Depressed and Flat  Mood: depressed  Thought process: normal  Thought content:   WNL  Sensory/Perceptual disturbances:   WNL  Orientation: oriented to person, place, time/date, and situation  Attention: Good  Concentration: Good  Memory: Pt c/o ST memory issues only  Fund of knowledge:  Good  Insight:   Fair  Judgment:  Good  Impulse Control: Good   Risk Assessment: Danger to Self:  No Self-injurious Behavior: No Danger to Others: No Duty to Warn:no Physical Aggression / Violence:No  Access to Firearms a concern: No  Gang Involvement:No   Subjective: Pt is concerned for his communication w/his Wife Rita & not wanting to upset her in the home. He does not want to, throw her under the bus & he admits to walking on eggshells in the home lately. He had ceased to communicate w/her as much bc this seems to upset her often & he can never say anything right.   We discussed briefly his Neuro Eval w/Sara Wertman, PA-C in May of 2023 & if he & Wife feel his situation is worsening, Camie has indicated he may need more testing or a PET Scan. He is concerned for his ST Memory as he forgets things his Wife tells him & she gets irritated thinking he is not listening. Suggestion to Pt is to perform repetition & rehearsal skills to see if this assists his memory.   Strengthened Pt belief in the psychotherapy process today & will cont to do this leaving room for questions & concerns about our  process.  Interventions: Psycho-education/Bibliotherapy and Insight-Oriented  Diagnosis:Adjustment disorder with mixed anxiety and depressed mood  Memory difficulties  Plan: Next visit we will introduce mnemonics, imagery, & association skills to improve his memory. Today, he will move forward w/the skills discussed & report back when we return to Cpl Th how these skills are developing.  Target Date: 05/23/2023  Progress: 3  Frequency: Once every 2-3 wks  Modality: Indiv  Next visit w/Cpl will be after Wife's visit next session. We will discuss the skills in 'Difficult Conversations' by Bethena Dayhoff, & Heen.  [name], LMFT

## 2023-04-25 DIAGNOSIS — G4733 Obstructive sleep apnea (adult) (pediatric): Secondary | ICD-10-CM | POA: Diagnosis not present

## 2023-04-27 ENCOUNTER — Ambulatory Visit: Payer: Medicare HMO | Admitting: Behavioral Health

## 2023-04-27 DIAGNOSIS — F4323 Adjustment disorder with mixed anxiety and depressed mood: Secondary | ICD-10-CM

## 2023-04-27 NOTE — Progress Notes (Signed)
[facility] Counselor/Therapist Progress Note  Patient ID: YUSSUF ASTACIO, MRN: [id number],    Date: 04/27/2023  Time Spent: 55 min In Person @ [facility] Office Time In: 9:00am Time Out: 9:55am   Treatment Type: Indiv  Reported Symptoms: Elevated anx/dep, & tears for the marital situation & her feeling no relief from the stress for an extended period.  Mental Status Exam: Appearance:  Casual and Neat     Behavior: Appropriate, Sharing, and emotional  Motor: Normal  Speech/Language:  Clear and Coherent  Affect: Tearful  Mood: anxious, depressed, and sad  Thought process: normal  Thought content:   WNL  Sensory/Perceptual disturbances:   WNL  Orientation: oriented to person, place, time/date, and situation  Attention: Good  Concentration: Good  Memory: WNL  Fund of knowledge:  Good  Insight:   Good  Judgment:  Good  Impulse Control: Good   Risk Assessment: Danger to Self:  No Self-injurious Behavior: No Danger to Others: No Duty to Warn:no Physical Aggression / Violence:No  Access to Firearms a concern: No  Gang Involvement:No   Subjective: Wife Ghent Sink in session alone today recounting the recent Hx of her Husb's Cog Testing w/Sara Wertman, PA-C & Husb's responses to the Feedback session. This Provider insisted on Pt getting Cpl Cslg & they have finally f/u on this Referral. Husb "respects" this Clinician & will return to Therapy.  Wife notes Husb pushes the limits, is very controlling, & gets angry w/her when she reminds him of things, insists on his participation, a/or tells him what to do. He is always blaming Pt for things & excluding her & their Son Madelaine Bhat from partaking in interactive events @ Church & w/friends. He is argumentative w/her & others & this beh has gotten worse.   Wife relates that Husb's FOO has profound infl on him & events in the recent past w/death of his Parents 16mos apart.  Pt's Wife is very spiritual/religious person & shares this  w/her Husb. He is a traditional thinker about gender roles. She does not agree w/this. Wife has her own Indiv Cslr; Fenton Malling whom gave her this Referral for Cpl work. Pt is unwilling to be controlled & has purchased her own separate home. Husb disagrees w/this.  Interventions: Interpersonal  Diagnosis:Adjustment disorder with mixed anxiety and depressed mood  Plan: We will cont w/Cpl Cslg process & take all views into account during our sessions w/no secrets being kept by Clinician. Wife is hopeful & wants the marital conflict, stress & strain to reduce via the Cpl Th process.  Target Date: 05/23/2023  Progress: 5  Frequency: Once every 2-3 wks  Modality: Claretta Fraise, LMFT

## 2023-04-27 NOTE — Progress Notes (Signed)
                Anastasya Jewell L Farryn Linares, LMFT 

## 2023-05-13 ENCOUNTER — Ambulatory Visit: Payer: Medicare HMO | Admitting: Behavioral Health

## 2023-05-13 DIAGNOSIS — F4323 Adjustment disorder with mixed anxiety and depressed mood: Secondary | ICD-10-CM | POA: Diagnosis not present

## 2023-05-13 DIAGNOSIS — R413 Other amnesia: Secondary | ICD-10-CM

## 2023-05-13 DIAGNOSIS — S90221A Contusion of right lesser toe(s) with damage to nail, initial encounter: Secondary | ICD-10-CM | POA: Diagnosis not present

## 2023-05-13 NOTE — Progress Notes (Signed)
   Deneise Lever, LMFT

## 2023-05-13 NOTE — Progress Notes (Signed)
 [facility] Counselor/Therapist Progress Note  Patient ID: Christian Landry, MRN: [id number],    Date: 06/02/2023  Time Spent: 55 min In Person @[facility]  - [facility] Office Time In: 11:00am Time Out: 11:55am   Treatment Type: Individual Therapy  Reported Symptoms: Elevated anx/dep & stress due to martial dissonance  Mental Status Exam: Appearance:  Casual     Behavior: Appropriate, Sharing, and Minimizing  Motor: Normal  Speech/Language:  Clear and Coherent and Normal Rate  Affect: Congruent  Mood: anxious and depressed  Thought process: normal  Thought content:   WNL  Sensory/Perceptual disturbances:   WNL  Orientation: oriented to person, place, time/date, and situation  Attention: Good  Concentration: Good  Memory: Pt sts he forgets things like names & this is an issue  Fund of knowledge:  Good  Insight:   Fair  Judgment:  Good  Impulse Control: Good   Risk Assessment: Danger to Self:  No Self-injurious Behavior: No Danger to Others: No Duty to Warn:no Physical Aggression / Violence:No  Access to Firearms a concern: No  Gang Involvement:No   Subjective: Christian Landry presents w/o his Wife Christian Landry today explaining the current state of the marriage. He is a media planner w/in the relationship. He does not, rock the boat. He has a highly tuned ability, to not make her angry. His goal is, don't upset her. They are not getting along due to her comments, moods, & emotionality. He expresses less affection from her now. He does not push Px affection. He hugs & that is it. Pt sts his needs are not met. He admits it takes, a long time to perform & often he can't finish. It is difficult to ejaculate. Pt lets Wife Christian Landry initiate sexual intimacy. She sets the mood. He fllws her cues. She has a short fuse & will blow up.    Pt reports he has been stepping back in the relationship for 5 yrs. He enjoys doing things for his Wife. They do have separate interests & his  standards for a good day w/her are high. He feels he makes many concessions to her. She is, off balance, anxious, frazzled, & worries about everybody. She is in a separate residence & the Rise is up in about a month. They will need to have a discussion about this.    Interventions: Ego-Supportive, Psycho-education/Bibliotherapy, and Insight-Oriented  Diagnosis:Adjustment disorder with mixed anxiety and depressed mood  Memory difficulties  Plan: Promoted Referrals to [facility] & Christian Sevin, PA-C for memory concerns. We will cont w/Cpl Th as indicated & separate out to indiv sessions as needed. We will reassess the Cpl's goal @ our next session.  Target Date: 06/06/2023  Progress: 5  Frequency: Once every 2-3 wks  Modality: Christian Richerd LITTIE Hollace, LMFT

## 2023-05-26 DIAGNOSIS — G4733 Obstructive sleep apnea (adult) (pediatric): Secondary | ICD-10-CM | POA: Diagnosis not present

## 2023-06-02 ENCOUNTER — Ambulatory Visit: Payer: Medicare HMO | Admitting: Behavioral Health

## 2023-06-02 DIAGNOSIS — R413 Other amnesia: Secondary | ICD-10-CM | POA: Diagnosis not present

## 2023-06-02 DIAGNOSIS — F4323 Adjustment disorder with mixed anxiety and depressed mood: Secondary | ICD-10-CM | POA: Diagnosis not present

## 2023-06-02 DIAGNOSIS — G4733 Obstructive sleep apnea (adult) (pediatric): Secondary | ICD-10-CM | POA: Diagnosis not present

## 2023-06-02 NOTE — Progress Notes (Unsigned)
   Christian Lever, LMFT

## 2023-06-02 NOTE — Progress Notes (Unsigned)
 [facility] Counselor/Therapist Progress Note  Patient ID: Christian Landry, MRN: [id number],    Date: 06/03/2023  Time Spent: 55 min In Person  @[facility]  - [facility] Office Time In: 10:00am Time Out: 10:55am   Treatment Type: Christian Landry  Reported Symptoms: Elevated marital distress w/anx & dep  Mental Status Exam: Appearance:  Casual and Neat     Behavior: Appropriate and Sharing  Motor: Normal  Speech/Language:  Clear and Coherent  Affect: Depressed  Mood: sad  Thought process: normal  Thought content:   WNL  Sensory/Perceptual disturbances:   WNL  Orientation: oriented to person, place, time/date, and situation  Attention: Good  Concentration: Good  Memory: Pt sts he has memory issues & often cannot remember names . He also sts this is a problem for his Wife. This has been consistent since initial visit.   Fund of knowledge:  Good  Insight:   Fair  Judgment:  Good  Impulse Control: Good   Risk Assessment: Danger to Self:  No Self-injurious Behavior: No Danger to Others: No Duty to Warn:no Physical Aggression / Violence:No  Access to Firearms a concern: No  Gang Involvement:No   Subjective: Pt's intent for psychotherapy is to be a better person, learn about himself & his relationship, & try to make his Wife happy. Retirement has been an arduous transition for him & it is not what he thought it would be-he worries for finances & the state of his marriage. He ruminates over the state of his Son Christian Landry's marriage & their finances.  Pt knows his Wife has mental health issues & has psychotherapy for this regularly; he tries not to add to her frustrations & wants to keep the peace. She also worries for her Son's marriage & finances. Son has recently been suspended for 90 days fllw'g the consumption of a beer during lunch @ work. Pt has been suggesting he find another job in the [place] & work towards [employer].  Wife has moved out of the marital home w/a Christian Landry that ends in 1-4  mos (?). Pt wants a discussion about this bc he feels Wife moved out due to his constant presence in the home. Pt is very self-effacing & blames himself for the marital upset btwn them. He wants his relationship to work. He loves his Wife & is trying to understand the disruption in the partnership @ this point in time.  Interventions: Insight-Oriented and Interpersonal  Diagnosis:Adjustment disorder with mixed anxiety and depressed mood  Memory difficulties  OSA (obstructive sleep apnea)  Plan: Christian Landry will consider asking Wife to join him in a Cpl session again before scheduling. Otherwise, he will call the Office to schedule for himself. He is confident his Wife will attend, but does not want to, "rock the boat". Clinician will wait on Pt to decide & discuss.  Target Date: 06/20/2023  Progress: 5  Frequency: Once every 2-3 wks w/Cpl & Indiv sessions interspersed  Modality: Claretta Fraise, LMFT

## 2023-06-03 DIAGNOSIS — G4733 Obstructive sleep apnea (adult) (pediatric): Secondary | ICD-10-CM | POA: Diagnosis not present

## 2023-06-23 DIAGNOSIS — G4733 Obstructive sleep apnea (adult) (pediatric): Secondary | ICD-10-CM | POA: Diagnosis not present

## 2023-07-03 ENCOUNTER — Telehealth: Payer: Self-pay | Admitting: Behavioral Health

## 2023-07-24 DIAGNOSIS — G4733 Obstructive sleep apnea (adult) (pediatric): Secondary | ICD-10-CM | POA: Diagnosis not present

## 2023-07-27 DIAGNOSIS — E538 Deficiency of other specified B group vitamins: Secondary | ICD-10-CM | POA: Diagnosis not present

## 2023-07-27 DIAGNOSIS — E782 Mixed hyperlipidemia: Secondary | ICD-10-CM | POA: Diagnosis not present

## 2023-07-27 DIAGNOSIS — D508 Other iron deficiency anemias: Secondary | ICD-10-CM | POA: Diagnosis not present

## 2023-07-27 DIAGNOSIS — Z125 Encounter for screening for malignant neoplasm of prostate: Secondary | ICD-10-CM | POA: Diagnosis not present

## 2023-07-27 DIAGNOSIS — I10 Essential (primary) hypertension: Secondary | ICD-10-CM | POA: Diagnosis not present

## 2023-07-29 ENCOUNTER — Ambulatory Visit: Admitting: Behavioral Health

## 2023-07-29 DIAGNOSIS — F4323 Adjustment disorder with mixed anxiety and depressed mood: Secondary | ICD-10-CM | POA: Diagnosis not present

## 2023-07-29 DIAGNOSIS — G3184 Mild cognitive impairment, so stated: Secondary | ICD-10-CM

## 2023-07-29 NOTE — Progress Notes (Signed)
   Christian Lever, LMFT

## 2023-07-31 DIAGNOSIS — F320 Major depressive disorder, single episode, mild: Secondary | ICD-10-CM | POA: Diagnosis not present

## 2023-07-31 DIAGNOSIS — R4189 Other symptoms and signs involving cognitive functions and awareness: Secondary | ICD-10-CM | POA: Diagnosis not present

## 2023-07-31 DIAGNOSIS — F411 Generalized anxiety disorder: Secondary | ICD-10-CM | POA: Diagnosis not present

## 2023-07-31 DIAGNOSIS — G4700 Insomnia, unspecified: Secondary | ICD-10-CM | POA: Diagnosis not present

## 2023-08-06 DIAGNOSIS — M7918 Myalgia, other site: Secondary | ICD-10-CM | POA: Diagnosis not present

## 2023-08-10 DIAGNOSIS — G5701 Lesion of sciatic nerve, right lower limb: Secondary | ICD-10-CM | POA: Diagnosis not present

## 2023-08-19 ENCOUNTER — Ambulatory Visit: Admitting: Behavioral Health

## 2023-08-19 DIAGNOSIS — F4323 Adjustment disorder with mixed anxiety and depressed mood: Secondary | ICD-10-CM

## 2023-08-19 DIAGNOSIS — R413 Other amnesia: Secondary | ICD-10-CM | POA: Diagnosis not present

## 2023-08-19 NOTE — Progress Notes (Signed)
   Deneise Lever, LMFT

## 2023-08-19 NOTE — Progress Notes (Signed)
 [facility] Counselor/Therapist Progress Note  Patient ID: Christian Landry, MRN: [id number],    Date: 08/19/2023  Time Spent: 50 min In Person @ [facility] Office Time In: 9:00am Time Out: 9:50am   Treatment Type: Individual Therapy  Reported Symptoms: Inc in anx/dep & stress due to Wife's rxn over the wknd to cleaning her Father's home of clutter, trash, & debri.   Mental Status Exam: Appearance:  Casual     Behavior: Appropriate and Sharing  Motor: Normal  Speech/Language:  Clear and Coherent  Affect: Appropriate  Mood: normal  Thought process: normal  Thought content:   WNL  Sensory/Perceptual disturbances:   WNL  Orientation: oriented to person, place, time/date, and situation  Attention: Good  Concentration: Good  Memory: Pt acknowledges some forgetfulness  Fund of knowledge:  Good  Insight:   Fair  Judgment:  Good  Impulse Control: Good   Risk Assessment: Danger to Self:  No Self-injurious Behavior: No Danger to Others: No Duty to Warn:no Physical Aggression / Violence:No  Access to Firearms a concern: No  Gang Involvement:No   Subjective: Pt is pleasant & receptive today. He & Wife Christian Landry spent time over the wknd cleaning her Father's home. He was irritated & complained the entire time to her for this beh. They threw out much trash & debri. Pt cleaned the Refridgerator, the Kitchen table & the bathrooms. Wife was emot'l & upset over Father's Tx of her. Pt made attempts to lighten the situation & console her. She could not regulate her sad feelings.  Pt is being understanding of his Wife's feelings & wants her to be happy.   Interventions: Solution-Oriented/Positive Psychology and Family Systems; Provided Pt w/aging & the elderly psychoedu. Resources for the prevention of falls in the home.  Diagnosis:Adjustment disorder with mixed anxiety and depressed mood  Memory difficulties  Plan: Christian Landry will keep in mind the psychoedu offered today to assist him  & his Wife when they are @ her Father's home. Christian Landry is 68yo. They will both keep in mind the aging process & how they can better assist him when the intentions for them cleaning his home focus on hygiene & prevention of falls.   Target Date: 09/20/2023  Progress: 6  Frequency: Once monthly or TBD if Wife attends  Modality: Deborrah Fam, LMFT

## 2023-08-23 DIAGNOSIS — G4733 Obstructive sleep apnea (adult) (pediatric): Secondary | ICD-10-CM | POA: Diagnosis not present

## 2023-08-26 ENCOUNTER — Ambulatory Visit: Admitting: Behavioral Health

## 2023-09-01 DIAGNOSIS — G4733 Obstructive sleep apnea (adult) (pediatric): Secondary | ICD-10-CM | POA: Diagnosis not present

## 2023-09-14 ENCOUNTER — Ambulatory Visit: Admitting: Behavioral Health

## 2023-09-30 ENCOUNTER — Telehealth: Payer: Self-pay | Admitting: Physician Assistant

## 2023-09-30 DIAGNOSIS — G4700 Insomnia, unspecified: Secondary | ICD-10-CM | POA: Diagnosis not present

## 2023-09-30 DIAGNOSIS — F411 Generalized anxiety disorder: Secondary | ICD-10-CM | POA: Diagnosis not present

## 2023-09-30 DIAGNOSIS — F320 Major depressive disorder, single episode, mild: Secondary | ICD-10-CM | POA: Diagnosis not present

## 2023-09-30 NOTE — Telephone Encounter (Signed)
 Pt. Wants to discuss NeuroPysc retesting

## 2023-10-01 NOTE — Telephone Encounter (Signed)
 Patient needs to be seen, sent to the front office to schedule. Patient thanked me for the return call.

## 2023-10-05 ENCOUNTER — Telehealth: Payer: Self-pay | Admitting: Physician Assistant

## 2023-10-05 NOTE — Telephone Encounter (Signed)
 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 The fax number is [phone].

## 2023-10-05 NOTE — Telephone Encounter (Signed)
 Copied from CRM [id number]. 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 States there is no deadline but would like to have it done within the week.   Patient is requesting a copy of the report be faxed to personal fax at [phone].    Left message on VM for patient to call back

## 2023-10-06 ENCOUNTER — Ambulatory Visit: Admitting: Behavioral Health

## 2023-10-07 ENCOUNTER — Ambulatory Visit: Admitting: Physician Assistant

## 2023-10-07 NOTE — Progress Notes (Incomplete)
 Assessment/Plan:    Memory concerns  Christian Landry is a very pleasant 68 y.o. RH male with a history of subjective memory difficulties, with prior neuropsych evaluation yielding normal testing presenting today in follow-up for evaluation of worsening memory complaints.  As recall, memory patterns were not concerning for neurodegenerative disease or cerebrovascular disease.  His mood is being managed by counselor, including marriage therapy.***.     Recommendations:   Follow up in   months. Recommend good control of cardiovascular risk factors Continue to control mood as per PCP    Subjective:   This patient is accompanied in the office by his wife***  who supplements the history. Previous records as well as any outside records available were reviewed prior to todays visit.   Patient was last seen on 09/16/2021.  MoCA at that time was 27/30.***.    Any changes in memory since last visit? . repeats oneself?  Endorsed Disoriented when walking into a room?  Patient denies ***  Misplacing objects?  Patient denies   Wandering behavior?   Denies. Any personality changes since last visit? Denies.   Any worsening depression?:  He is seeing a counselor for marriage therapy*** Hallucinations or paranoia?  Denies.   Seizures?   Denies.    Any sleep changes? Sleeps well***. Does not sleep very well***.   Denies vivid dreams, REM behavior or sleepwalking   Sleep apnea?   denies ***  Any hygiene concerns?   Denies.   Independent of bathing and dressing?  Endorsed  Does the patient needs help with medications? Patient is in charge *** Who is in charge of the finances?  Patient is in charge   *** Any changes in appetite?  denies ***   Patient have trouble swallowing?  Denies.   Does the patient cook?  Any kitchen accidents such as leaving the stove on?   Denies.   Any headaches?    Denies.   Vision changes? Denies. Chronic pain?  Denies.   Ambulates with difficulty?    Denies. ***   Recent falls or head injuries?    Denies.      Unilateral weakness, numbness or tingling?  Denies.   Any tremors?  Denies.   Any anosmia?    Denies.   Any incontinence of urine?  Denies.   Any bowel dysfunction?  Denies.      Patient lives .*** Does the patient drive?***     Neuropsych evaluation on 08/27/2021 [name]'s pattern of performance is suggestive of neuropsychological functioning within normal limits relative to age-matched peers. Slight performance variability was exhibited across processing speed, executive functioning, and verbal memory (list learning); however, performances were largely adequate among these domains. Performances were also adequate across attention/concentration, safety/judgment, receptive and expressive language, visuospatial abilities, and learning and memory. There is the potential for some below average performances represent a subtle decline from a previously higher level of functioning. However, there is no prior testing available for comparison purposes and these scores may simply represent normal intraindividual variability or longstanding strengths and weaknesses. [name] denied difficulties completing instrumental activities of daily living (ADLs) independently.   Perceptions of his functioning held by himself and his wife (who was not present during the current interview) appear extremely disparate from one another. While he essentially denied all cognitive difficulties, personality changes, behavioral concerns, and REM sleep behaviors, his wife has noted ongoing cognitive decline, described his behavior as erratic, noted ongoing personality changes to the point where she has seemed  fearful, and described significant REM sleep behaviors. Given these disputing reports, I am unable to determine which is the more accurate presentation. There certainly remains the potential that [name] attempted to present himself in a favorable light during the current  interview and actively denied all concerns. This also appears to be consistent throughout much of his medical records as his wife commonly reports far greater concerns.    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 Behavioral disinhibition and personality changes are the hallmark characteristic of this illness and his age is certainly within the expected range when this illness most commonly presents itself. Cognitive dysfunction can be minimal or even completely absent across objective testing when in earlier stages, meaning that currently benign testing results would not exclude this illness from being present. This illness remains plausible at the present time and continued medical monitoring will be important moving forward.    The presence of REM sleep behaviors are rare in FTD presentations and more often associated with Lewy body disease. Despite this, Mr. [name] current behavioral and cognitive profile is not consistent with Lewy body disease at the present time. Memory patterns are not concerning for typically presenting early-onset Alzheimer's disease and recent neuroimaging did not reveal cerebrovascular changes to warrant consideration of a primary vascular cognitive disorder. He denied acute symptoms of anxiety and depression and denied a lengthy history of significant psychiatric distress throughout his life.       Initial Evaluation 03/11/21 The patient is seen in neurologic consultation at the request of [name], MD for the evaluation of memory.  The patient is accompanied by his wife who supplements the history. This is a 68 y.o. year old male who has had gradual memory issues for about 2 or 3 years, worse over the last year.  His wife noticed that the symptoms are worse over the last year.  She states that he  is overloaded with work, he is participating in [employer], and despite being very organized, over the last year he has been having trouble with organization.  He usually keeps a pocket calendar, but he has been misplacing it, which creates some frustration.  He has to write it in a piece of paper otherwise he becomes very frustrated .  She also reports that he is more confused, cannot complete a task, has trouble finding objects, and becomes overloaded .  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  She has a history of anxiety, and this creates increased anxiety in her and in the relationship according to her.  He may go to the basement and can find something very quickly, but he cannot remember if he lost something in the first place unless he speaks out loud .  He likes to cook, denies leaving the stove on.  His wife states that he has lost recipes that usually are in the collection of recipes in the pantry.  She states that his response will be I do not remember that I was looking for them . He puts his clean laundry in the  basket and rolls  the socks in a specific way but lately he has  randomly tied them in a knot  which was uncharacteristic. He does asked the same questions and repeats the same stories. He continues to drive, and recently, they had not slept  for about 2 days due to their son's wedding, which increased stress during that time, and upon turning into the driveway, he lost direction of the car.  As mentioned earlier, he reports this is not related to confusion, it was a lack of sleep .  Of note, he reports that goes to sleep at midnight, may wake up between 2 and 3 in the morning, I only sleep about 3 hours and go to the office at 6:30 AM .  His wife states that he snores loudly.  He has never been tested for sleep apnea.   He denies a history of depression, but he does report that he is sad  that  this is making her sad.He denies hallucinations or  paranoia, no hygiene concerns are noted, he is independent of bathing and dressing.  He does not miss any medication doses, and he is independent on finances, does not forget to pay any bills.  His appetite is normal, denies trouble swallowing.  He ambulates without assistance of cane or walker, denies any recent falls.  Years ago, he had a branch of a tree falling into his forehead, leaving a scar, but he denies that this was deep, he reports that the injury was superficial, no loss of consciousness.Denies headaches, double vision, dizziness, focal numbness or tingling, unilateral weakness or tremors or anosmia. No history of seizures. Denies urine incontinence, retention, constipation or diarrhea.  Denies ETOH or Tobacco. Family History remarkable for both parents with dementia, and one  maternal grand aunt with dementia possibly Alzheimer's   MRI brain 12/21/22Unremarkable non-contrast MRI appearance of the brain for age. No evidence of acute intracranial abnormality.Minimal mucosal thickening within the bilateral ethmoid and maxillary sinuses.Trace fluid within the right mastoid air cells.  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   PREVIOUS MEDICATIONS:   CURRENT MEDICATIONS:  Outpatient Encounter Medications as of 10/07/2023  Medication Sig   amLODipine (NORVASC) 5 MG tablet amlodipine 5 mg tablet  TAKE 1 TABLET BY MOUTH ONCE DAILY FOR BLOOD PRESSURE   Apoaequorin (PREVAGEN PO) Take by mouth.   aspirin EC 81 MG tablet 1 tablet   azelastine (ASTELIN) 0.1 % nasal spray Place 2 sprays into both nostrils 2 (two) times daily. (Patient not taking: Reported on 03/17/2023)   cyanocobalamin  1000 MCG tablet Take 1,000 mcg by mouth  daily.   donepezil  (ARICEPT ) 10 MG tablet Take 1 tablet (10 mg total) by mouth at bedtime.   escitalopram  (LEXAPRO ) 10 MG tablet Take 1 tablet (10 mg total) by mouth daily.   ferrous sulfate 324 MG TBEC Take 324 mg by mouth.   lisinopril (ZESTRIL) 20 MG tablet lisinopril 20 mg tablet  TAKE 1 TABLET BY MOUTH EVERY DAY FOR BLOOD PRESSURE   mirtazapine  (REMERON ) 15 MG tablet Take 1 tablet (15 mg total) by mouth at bedtime.   Multiple Vitamins-Minerals (MULTIVITAMIN ADULT EXTRA C PO) 1 tablet   Omega-3 Fatty Acids (FISH OIL) 1000 MG CAPS Take 1,000 mg by mouth daily.   sildenafil (VIAGRA) 100 MG tablet SMARTSIG:0.5-1 Tablet(s) By Mouth PRN   No facility-administered encounter medications on file as of 10/07/2023.     Objective:  PHYSICAL EXAMINATION:    VITALS:  There were no vitals filed for this visit.  GEN:  The patient appears stated age and is in NAD. HEENT:  Normocephalic, atraumatic.   Neurological examination:  General: NAD, well-groomed, appears stated age. Orientation: The patient is alert. Oriented to person, place and not to date.*** Cranial nerves: There is good facial symmetry.The speech is fluent and clear. No aphasia or dysarthria. Fund of knowledge is appropriate. Recent memory impaired and remote memory is normal.  Attention and concentration are normal.  Able to name objects and repeat phrases.  Hearing is intact to conversational tone ***.   Delayed recall *** Sensation: Sensation is intact to light touch throughout Motor: Strength is at least antigravity x4. DTR's 2/4 in UE/LE      03/11/2021    8:00 AM  Montreal Cognitive Assessment   Visuospatial/ Executive (0/5) 5  Naming (0/3) 3  Attention: Read list of digits (0/2) 2  Attention: Read list of letters (0/1) 1  Attention: Serial 7 subtraction starting at 100 (0/3) 3  Language: Repeat phrase (0/2) 2  Language : Fluency (0/1) 1  Abstraction (0/2) 2  Delayed Recall (0/5) 2  Orientation (0/6) 6  Total 27         No data to display             Movement examination: Tone: There is normal tone in the UE/LE Abnormal movements:  no tremor.  No myoclonus.  No asterixis.   Coordination:  There is no decremation with RAM's. Normal finger to nose  Gait and Station: The patient has no difficulty arising out of a deep-seated chair without the use of the hands. The patient's stride length is good.  Gait is cautious and narrow.   Thank you for allowing us  the opportunity to participate in the care of this nice patient. Please do not hesitate to contact us  for any questions or concerns.   Total time spent on today's visit was *** minutes dedicated to this patient today, preparing to see patient, examining the patient, ordering tests and/or medications and counseling the patient, documenting clinical information in the EHR or other health record, independently interpreting results and communicating results to the patient/family, discussing treatment and goals, answering patient's questions and coordinating care.  Cc:  [name], MD (Inactive)  Camie Sevin 10/07/2023 5:53 AM

## 2023-10-12 DIAGNOSIS — M7918 Myalgia, other site: Secondary | ICD-10-CM | POA: Diagnosis not present

## 2023-10-13 ENCOUNTER — Ambulatory Visit: Admitting: Behavioral Health

## 2023-10-16 ENCOUNTER — Ambulatory Visit: Admitting: Physician Assistant

## 2023-10-16 VITALS — BP 118/69 | HR 82 | Resp 20 | Wt 179.0 lb

## 2023-10-16 DIAGNOSIS — R413 Other amnesia: Secondary | ICD-10-CM | POA: Diagnosis not present

## 2023-10-16 NOTE — Addendum Note (Signed)
 Addended by: DINA CREDIT E on: 10/16/2023 04:56 PM   Modules accepted: Level of Service

## 2023-10-16 NOTE — Patient Instructions (Addendum)
 neuropsych evaluation MRI brain [phone]

## 2023-10-16 NOTE — Progress Notes (Addendum)
 Assessment/Plan:    Subjective memory loss  Christian Landry is a delightful 68 y.o. RH male with a history of iron deficiency anemia, B12 deficiency, prior history of insomnia, hypertension, hyperlipidemia, subjective memory difficulties, with prior neuropsych evaluation yielding normal testing presenting today in follow-up for evaluation of subjective memory complaints, although there is a component of attention.  The patient is quite distracted during this visit, frequently looking at his phone, not engaging in participating in a conversation, behavior that is more concerning for adult ADHD than of memory disorder.  MMSE today 30/30.  As recalled, during his last neuropsych evaluation in 2023, memory patterns were not concerning for neurodegenerative disease or cerebrovascular disease.  His mood is being managed by behavioral therapy.  Of note, the patient is on donepezil  10 mg daily by his behavioral therapist.   Recommendations:   Repeat neuropsych evaluation for diagnostic clarity MRI of the brain to the new structural abnormalities and for further evaluation of his vascular load Continue psychotherapy for adjustment disorder  Patient is on donepezil  10 mg daily by behavioral therapy. Recommend good control of cardiovascular risk factors Continue to control mood as per behavioral health Follow-up pending on the results of neuropsych evaluation   Subjective:   This patient is accompanied in the office by his wife who supplements the history. Previous records as well as any outside records available were reviewed prior to todays visit.   Patient was last seen on 09/16/2021.  MoCA at that time was 27/30. [name]    Any changes in memory since last visit?  Worse he states.reports that STM is worse than LTM, especially over the last 6 months.  I cannot remember what I ate for breakfast yesterday .  Patient has some difficulty remembering recent conversations and names of people.  He was  delivered with repeat of his ADLs and to drive.  He is always doing something, but not sociable .  His wife says. repeats oneself?  Endorsed Disoriented when walking into a room?  Patient denies    Misplacing objects?  Endorsed by patient  Wandering behavior?   Denies. Any personality changes since last visit?  He distances himself from others and from me his wife says.  He is more distracted than before, constantly dependent on his phone. Any worsening depression?:  He is seeing a counselor for  adjustment disorder, recently he was on marriage counseling as well, not currently.   Hallucinations or paranoia?  Denies.   Seizures?   Denies.    Any sleep changes? Sleeps well with mirtazapine . Report vivid dreams, REM behavior or sleepwalking   Sleep apnea?   Endorsed, uses the CPAP    Any hygiene concerns?   Denies.   Independent of bathing and dressing?  Endorsed  Does the patient needs help with medications? Patient is in charge   Who is in charge of the finances?  Patient is in charge    Any changes in appetite?  denies.  He eats well     Patient have trouble swallowing?  Denies.   Does the patient cook? Not much.  Any kitchen accidents such as leaving the stove on?   Denies.   Any headaches?    Denies.   Vision changes? Denies. Chronic pain?  He has sciatica and back pain, to likely have injections soon. Ambulates with difficulty?    Denies.  Recent falls or head injuries?    Denies.      Unilateral weakness, numbness or tingling?  Denies.   Any tremors?  Denies.   Any anosmia?    Denies.   Any incontinence of urine?  Denies.   Any bowel dysfunction?  Denies.      Patient lives with his wife Does the patient drive? Not as much, mostly short distances     Neuropsych evaluation on 08/27/2021 [name]'s pattern of performance is suggestive of neuropsychological functioning within normal limits relative to age-matched peers. Slight performance  variability was exhibited across processing speed, executive functioning, and verbal memory (list learning); however, performances were largely adequate among these domains. Performances were also adequate across attention/concentration, safety/judgment, receptive and expressive language, visuospatial abilities, and learning and memory. There is the potential for some below average performances represent a subtle decline from a previously higher level of functioning. However, there is no prior testing available for comparison purposes and these scores may simply represent normal intraindividual variability or longstanding strengths and weaknesses. [name] denied difficulties completing instrumental activities of daily living (ADLs) independently.   Perceptions of his functioning held by himself and his wife (who was not present during the current interview) appear extremely disparate from one another. While he essentially denied all cognitive difficulties, personality changes, behavioral concerns, and REM sleep behaviors, his wife has noted ongoing cognitive decline, described his behavior as erratic, noted ongoing personality changes to the point where she has seemed fearful, and described significant REM sleep behaviors. Given these disputing reports, I am unable to determine which is the more accurate presentation. There certainly remains the potential that [name] attempted to present himself in a favorable light during the current interview and actively denied all concerns. This also appears to be consistent throughout much of his medical records as his wife commonly reports far greater concerns.    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 Behavioral disinhibition and personality changes are the hallmark characteristic of this  illness and his age is certainly within the expected range when this illness most commonly presents itself. Cognitive dysfunction can be minimal or even completely absent across objective testing when in earlier stages, meaning that currently benign testing results would not exclude this illness from being present. This illness remains plausible at the present time and continued medical monitoring will be important moving forward.    The presence of REM sleep behaviors are rare in FTD presentations and more often associated with Lewy body disease. Despite this, Mr. Robart current behavioral and cognitive profile is not consistent with Lewy body disease at the present time. Memory patterns are not concerning for typically presenting early-onset Alzheimer's disease and recent neuroimaging did not reveal cerebrovascular changes to warrant consideration of a primary vascular cognitive disorder. He denied acute symptoms of anxiety and depression and denied a lengthy history of significant psychiatric distress throughout his life.       Initial Evaluation 03/11/21 The patient is seen in neurologic consultation at the request of [name], MD for the evaluation of memory.  The patient is accompanied by his wife who supplements the history. This is a 68 y.o. year old male who has had gradual memory issues for about 2 or 3 years, worse over the last year.  His wife noticed that the symptoms are worse over the last  year.  She states that he is overloaded with work, he is participating in [employer], and despite being very organized, over the last year he has been having trouble with organization.  He usually keeps a pocket calendar, but he has been misplacing it, which creates some frustration.  He has to write it in a piece of paper otherwise he becomes very frustrated .  She also reports that he is more confused, cannot complete a task, has trouble finding objects, and becomes overloaded .  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  She has a history of anxiety, and this creates increased anxiety in her and in the relationship according to her.  He may go to the basement and can find something very quickly, but he cannot remember if he lost something in the first place unless he speaks out loud .  He likes to cook, denies leaving the stove on.  His wife states that he has lost recipes that usually are in the collection of recipes in the pantry.  She states that his response will be I do not remember that I was looking for them . He puts his clean laundry in the  basket and rolls  the socks in a specific way but lately he has  randomly tied them in a knot  which was uncharacteristic. He does asked the same questions and repeats the same stories. He continues to drive, and recently, they had not slept for about 2 days due to their son's wedding, which increased stress during that time, and upon turning into the driveway, he lost direction of the car.  As mentioned earlier, he reports this is not related to confusion, it was a lack of sleep .  Of note, he reports that goes to sleep at midnight, may wake up between 2 and 3 in the morning, I only sleep about 3 hours and go to the office at 6:30 AM .  His wife states that he snores loudly.  He has never been tested for sleep apnea.   He denies a history of depression, but he does report that he is sad  that  this is making her sad.He denies hallucinations or paranoia, no hygiene concerns are noted, he is independent of bathing and dressing.  He does not miss any medication doses, and he is independent on finances, does not forget to pay any bills.  His appetite is normal, denies trouble swallowing.  He ambulates without assistance of cane or walker, denies any recent falls.  Years ago, he had a branch of a tree falling into his forehead, leaving a scar, but he denies that this was deep, he reports that the injury was superficial, no  loss of consciousness.Denies headaches, double vision, dizziness, focal numbness or tingling, unilateral weakness or tremors or anosmia. No history of seizures. Denies urine incontinence, retention, constipation or diarrhea.  Denies ETOH or Tobacco. Family History remarkable for both parents with dementia, and one  maternal grand aunt with dementia possibly Alzheimer's   MRI brain 12/21/22Unremarkable non-contrast MRI appearance of the brain for age. No evidence of acute intracranial abnormality.Minimal mucosal thickening within the bilateral ethmoid and maxillary sinuses.Trace fluid within the right mastoid air cells.  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   PREVIOUS MEDICATIONS:   CURRENT MEDICATIONS:  Outpatient Encounter Medications as of 10/16/2023  Medication Sig   amLODipine (NORVASC) 5 MG tablet amlodipine 5 mg tablet  TAKE 1 TABLET BY MOUTH ONCE DAILY FOR BLOOD PRESSURE   Apoaequorin (PREVAGEN PO) Take by mouth.   aspirin EC 81 MG tablet 1 tablet   azelastine (ASTELIN) 0.1 % nasal spray Place 2 sprays into both nostrils 2 (two) times daily. (Patient not taking: Reported on 03/17/2023)   cyanocobalamin  1000 MCG tablet Take 1,000 mcg by mouth daily.   donepezil  (ARICEPT ) 10 MG tablet Take 1 tablet (10 mg total) by mouth at bedtime.   escitalopram  (LEXAPRO ) 10 MG tablet Take 1 tablet (10 mg total) by mouth daily.   ferrous sulfate 324 MG TBEC Take 324 mg by mouth.   lisinopril (ZESTRIL) 20 MG tablet lisinopril 20 mg tablet  TAKE 1 TABLET BY MOUTH EVERY DAY FOR BLOOD PRESSURE   mirtazapine  (REMERON ) 15 MG tablet Take 1 tablet (15 mg total) by mouth at bedtime.   Multiple Vitamins-Minerals (MULTIVITAMIN ADULT EXTRA  C PO) 1 tablet   Omega-3 Fatty Acids (FISH OIL) 1000 MG CAPS Take 1,000 mg by mouth daily.   sildenafil (VIAGRA) 100 MG tablet SMARTSIG:0.5-1 Tablet(s) By Mouth PRN   No facility-administered encounter medications on file as of 10/16/2023.     Objective:     PHYSICAL EXAMINATION:    VITALS:   Vitals:   10/16/23 1501  BP: 118/69  Pulse: 82  Resp: 20  SpO2: 95%  Weight: 179 lb (81.2 kg)    GEN:  The patient appears stated age and is in NAD. HEENT:  Normocephalic, atraumatic.   Neurological examination:  General: NAD, well-groomed, appears stated age.  Anxious appearing, easily distracted by the sound of his phone Orientation: The patient is alert. Oriented to person, place and date.  Cranial nerves: There is good facial symmetry.The speech is fluent and clear. No aphasia or dysarthria. Fund of knowledge is appropriate. Recent and remote memory is normal.  Attention and concentration are normal by testing.  Able to name objects and repeat phrases.  Hearing is intact to conversational tone . Delayed recall 3/3. Sensation: Sensation is intact to light touch throughout Motor: Strength is at least antigravity x4. DTR's 2/4 in UE/LE      03/11/2021    8:00 AM  Montreal Cognitive Assessment   Visuospatial/ Executive (0/5) 5  Naming (0/3) 3  Attention: Read list of digits (0/2) 2  Attention: Read list of letters (0/1) 1  Attention: Serial 7 subtraction starting at 100 (0/3) 3  Language: Repeat phrase (0/2) 2  Language : Fluency (0/1) 1  Abstraction (0/2) 2  Delayed Recall (0/5) 2  Orientation (0/6) 6  Total 27        No data to display             Movement examination: Tone: There is normal tone in the UE/LE Abnormal movements:  no tremor.  No myoclonus.  No asterixis.   Coordination:  There is no decremation with RAM's. Normal finger to nose  Gait and Station: The patient has no difficulty arising out of a deep-seated chair without the use of the hands. The  patient's stride length is good.  Gait is cautious and narrow.   Thank you for allowing us  the opportunity to participate in the  care of this nice patient. Please do not hesitate to contact us  for any questions or concerns.   Total time spent on today's visit was 31 minutes dedicated to this patient today, preparing to see patient, examining the patient, ordering tests and/or medications and counseling the patient, documenting clinical information in the EHR or other health record, independently interpreting results and communicating results to the patient/family, discussing treatment and goals, answering patient's questions and coordinating care.  Cc:  Auston Opal, ROSALEA [facility] 10/16/2023 4:52 PM

## 2023-10-19 ENCOUNTER — Encounter: Payer: Self-pay | Admitting: Physician Assistant

## 2023-10-19 DIAGNOSIS — T50905A Adverse effect of unspecified drugs, medicaments and biological substances, initial encounter: Secondary | ICD-10-CM | POA: Diagnosis not present

## 2023-10-19 DIAGNOSIS — E782 Mixed hyperlipidemia: Secondary | ICD-10-CM | POA: Diagnosis not present

## 2023-10-19 DIAGNOSIS — M7918 Myalgia, other site: Secondary | ICD-10-CM | POA: Diagnosis not present

## 2023-10-26 ENCOUNTER — Ambulatory Visit: Admitting: Behavioral Health

## 2023-10-26 DIAGNOSIS — M791 Myalgia, unspecified site: Secondary | ICD-10-CM | POA: Diagnosis not present

## 2023-10-26 DIAGNOSIS — E782 Mixed hyperlipidemia: Secondary | ICD-10-CM | POA: Diagnosis not present

## 2023-10-27 ENCOUNTER — Encounter: Payer: Self-pay | Admitting: Psychology

## 2023-10-27 DIAGNOSIS — D508 Other iron deficiency anemias: Secondary | ICD-10-CM | POA: Insufficient documentation

## 2023-10-27 DIAGNOSIS — T50905A Adverse effect of unspecified drugs, medicaments and biological substances, initial encounter: Secondary | ICD-10-CM | POA: Insufficient documentation

## 2023-10-27 DIAGNOSIS — E782 Mixed hyperlipidemia: Secondary | ICD-10-CM | POA: Insufficient documentation

## 2023-10-27 DIAGNOSIS — F329 Major depressive disorder, single episode, unspecified: Secondary | ICD-10-CM | POA: Insufficient documentation

## 2023-10-28 ENCOUNTER — Ambulatory Visit (INDEPENDENT_AMBULATORY_CARE_PROVIDER_SITE_OTHER): Admitting: Psychology

## 2023-10-28 ENCOUNTER — Ambulatory Visit

## 2023-10-28 ENCOUNTER — Encounter: Payer: Self-pay | Admitting: Psychology

## 2023-10-28 DIAGNOSIS — R4189 Other symptoms and signs involving cognitive functions and awareness: Secondary | ICD-10-CM

## 2023-10-28 DIAGNOSIS — G3184 Mild cognitive impairment, so stated: Secondary | ICD-10-CM | POA: Insufficient documentation

## 2023-10-28 NOTE — Progress Notes (Signed)
   Psychometrician Note   Cognitive testing was administered to Christian Landry by Luke Pitcher, B.S. (psychometrist) under the supervision of Dr. Arthea KYM Maryland, Ph.D., ABPP, licensed psychologist on 10/28/2023. Christian Landry did not appear overtly distressed by the testing session per behavioral observation or responses across self-report questionnaires. Rest breaks were offered.    The battery of tests administered was selected by Dr. Arthea KYM Maryland, Ph.D., ABPP with consideration to Christian Landry current level of functioning, the nature of his symptoms, emotional and behavioral responses during interview, level of literacy, observed level of motivation/effort, and the nature of the referral question. This battery was communicated to the psychometrist. Communication between Dr. Arthea KYM Maryland, Ph.D., ABPP and the psychometrist was ongoing throughout the evaluation and Dr. Arthea KYM Maryland, Ph.D., ABPP was immediately accessible at all times. Dr. Zachary C. Landry, Ph.D., ABPP provided supervision to the psychometrist on the date of this service to the extent necessary to assure the quality of all services provided.    Christian Landry will return within approximately 1-2 weeks for an interactive feedback session with Dr. Maryland at which time his test performances, clinical impressions, and treatment recommendations will be reviewed in detail. Christian Landry understands he can contact our office should he require our assistance before this time.  A total of 115 minutes of billable time were spent face-to-face with Christian Landry by the psychometrist. This includes both test administration and scoring time. Billing for these services is reflected in the clinical report generated by Dr. Arthea KYM Maryland, Ph.D., ABPP  This note reflects time spent with the psychometrician and does not include test scores or any clinical interpretations made by Dr. Maryland. The full report will follow in a separate note.

## 2023-10-28 NOTE — Progress Notes (Unsigned)
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         Scaled Score Scaled Score Percentile   WAIS-IV Similarities: --- 8 25 Average        D-KEFS Color-Word Interference Test: Raw Score (Scaled Score) Raw Score (Scaled Score) Percentile     Color Naming 46 secs. (4) 33 secs. (10) 50 Average    Word Reading 26 secs. (9) 23 secs. (11) 63 Average    Inhibition 56 secs. (12) 80 secs. (8) 25 Average      Total Errors 2 errors (10) 3 errors (9) 37 Average    Inhibition/Switching 76 secs. (10) 80 secs. (10) 50 Average      Total Errors 1 error (12) 5 errors (8) 25 Average        D-KEFS Verbal Fluency Test: Raw Score (Scaled Score) Raw Score (Scaled Score) Percentile     Letter Total Correct 39 (11) 35 (10) 50 Average    Category Total Correct 30 (8) 25 (6) 9 Below Average    Category Switching Total Correct 9 (6) 11 (8) 25 Average    Category Switching Accuracy 7 (6) 10 (9) 37 Average      Total Set Loss Errors 1 (11) 0 (13) 84 Above Average      Total Repetition Errors 2 (11) 1 (12) 75 Above Average        NAB Executive Functions Module, Form 1: T Score T Score Percentile     Judgment 60 55 69 Average        Language:       Verbal Fluency Test: Raw Score (T Score) Raw  Score (T Score) Percentile     Phonemic Fluency (FAS) 39 (49) 35 (46) 34 Average    Animal Fluency 15 (42) 14 (39) 14 Below Average         NAB Language Module, Form 1: T Score T Score Percentile     Auditory Comprehension --- 57 75 Above Average    Naming 31/31 (55) 31/31 (56) 73 Average        Visuospatial/Visuoconstruction:       Raw Score Raw Score Percentile   Clock Drawing: 10/10 10/10 --- Within Normal Limits        NAB Spatial Module, Form 1: T Score T Score Percentile     Figure Drawing Copy 58 58 79 Above Average         Scaled Score Scaled Score Percentile   WAIS-IV Block Design: 13 13 84 Above Average        Mood and Personality:       Raw Score Raw Score Percentile   Beck Depression Inventory - II: 4 11 --- Within Normal Limits  PROMIS Anxiety Questionnaire: 7 12 --- None to Slight        Additional Questionnaires:       Raw Score Raw Score Percentile   PROMIS Sleep Disturbance Questionnaire: 12 17 --- None to Slight   Informed Consent and Coding/Compliance:   The current evaluation represents a clinical evaluation for the purposes previously outlined by the referral source and is in no way reflective of a forensic evaluation.   Mr. Sides was provided with a verbal description of the nature and purpose of the present neuropsychological evaluation. Also reviewed were the foreseeable risks and/or discomforts and benefits of the procedure, limits of confidentiality, and mandatory reporting requirements of this provider. The patient was given the opportunity to ask questions and receive answers about the evaluation. Oral consent to participate was provided by the patient.   This evaluation was conducted by Arthea KYM Maryland, Ph.D., ABPP-CN, board certified clinical neuropsychologist. Mr. Arther completed a clinical interview with Dr. Maryland, billed as one unit [phone], and 115 minutes of cognitive testing and scoring, billed as one  unit [phone] and three additional units 96139. Psychometrist Luke Pitcher, B.S. assisted Dr. Maryland with test administration and scoring procedures. As a separate and discrete service, one unit [phone] and two units 96133 (160 minutes) were billed for Dr. Loralee time spent in interpretation and report writing.

## 2023-10-30 ENCOUNTER — Ambulatory Visit: Admitting: Behavioral Health

## 2023-10-30 DIAGNOSIS — M542 Cervicalgia: Secondary | ICD-10-CM | POA: Diagnosis not present

## 2023-10-30 DIAGNOSIS — M545 Low back pain, unspecified: Secondary | ICD-10-CM | POA: Diagnosis not present

## 2023-11-01 ENCOUNTER — Ambulatory Visit
Admission: RE | Admit: 2023-11-01 | Discharge: 2023-11-01 | Disposition: A | Discharge status: Home / Self Care | Source: Ambulatory Visit | Attending: Physician Assistant | Admitting: Physician Assistant

## 2023-11-01 DIAGNOSIS — R519 Headache, unspecified: Secondary | ICD-10-CM | POA: Diagnosis not present

## 2023-11-01 DIAGNOSIS — R413 Other amnesia: Secondary | ICD-10-CM | POA: Diagnosis not present

## 2023-11-02 ENCOUNTER — Telehealth: Payer: Self-pay

## 2023-11-02 NOTE — Telephone Encounter (Signed)
 Copied from CRM [id number]. 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 Patient calling asking for Joesph and stating it has not been faxed and would like to know if he can come in and pick it up. Please call patient.  Called patient.  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  Per Dr. Neysa, okay to print a 30 day compliance download from [facility] and call patient to pick up.  Called patient.  Patient will pick up today.  NFN at this time

## 2023-11-04 ENCOUNTER — Ambulatory Visit (INDEPENDENT_AMBULATORY_CARE_PROVIDER_SITE_OTHER): Admitting: Psychology

## 2023-11-04 DIAGNOSIS — G3184 Mild cognitive impairment, so stated: Secondary | ICD-10-CM | POA: Diagnosis not present

## 2023-11-04 NOTE — Progress Notes (Signed)
   Neuropsychology Feedback Session Christian Landry. [facility] [place] Department of Neurology  Reason for Referral:   Christian Landry is a 68 y.o. right-handed Caucasian male referred by Christian Sevin, PA-C, to characterize his current cognitive functioning and assist with diagnostic clarity and treatment planning in the context of subjective cognitive decline.   Feedback:   Christian Landry completed a comprehensive neuropsychological evaluation on 10/28/2023. Please refer to that encounter for the full report and recommendations. Briefly, results suggested impairment surrounding complex attention and delayed retrieval aspects of verbal memory. Further variability was exhibited across both encoding (i.e., learning) and recognition/consolidation aspects of verbal memory. Performances were appropriate relative to age-matched peers across all other assessed domains. Relative to his previous evaluation in May 2023, noteworthy decline was exhibited across verbal memory and complex attention. Outside of this, all other assessed domains exhibited stability over time. The etiology for ongoing memory decline is uncertain. Progressive memory decline does increase concern for an underlying neurodegenerative illness. Across verbal memory testing, Christian Landry was fully amnestic (i.e., 0% retention) across 2/3 tasks and only exhibited 27% retention across the remaining task. This certainly raises concern for rapid forgetting. This, when combined with variable benefit from cueing and the potential for an evolving storage impairment, could elevate concerns for Alzheimer's disease. There is also a known family history of this illness. It remains encouraging that visual memory has remained stable and intact, as has all non-memory domains commonly implicated by this illness. If Alzheimer's disease is indeed present, it would appear to be in very early stages at the present time.   Christian Landry was accompanied by his wife during the  current feedback session. Content of the current session focused on the results of his neuropsychological evaluation. Christian Landry was given the opportunity to ask questions and his questions were answered. He was encouraged to reach out should additional questions arise. A copy of his report was provided at the conclusion of the visit.      One unit 96132 (40 minutes) was billed for Dr. Loralee time spent preparing for, conducting, and documenting the current feedback session with Christian Landry.

## 2023-11-05 ENCOUNTER — Other Ambulatory Visit: Payer: Self-pay

## 2023-11-05 ENCOUNTER — Telehealth: Payer: Self-pay | Admitting: Physician Assistant

## 2023-11-05 DIAGNOSIS — R413 Other amnesia: Secondary | ICD-10-CM

## 2023-11-05 NOTE — Telephone Encounter (Signed)
 

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
## 2023-11-05 NOTE — Telephone Encounter (Signed)
 Pt's wife [name] called in today and she stated she had some questions and she is waiting for Nurse Bari to contact her back.

## 2023-11-05 NOTE — Telephone Encounter (Signed)
 Order lumbar spinal tap and biomarkers

## 2023-11-06 ENCOUNTER — Ambulatory Visit: Payer: Self-pay | Admitting: Neurology

## 2023-11-06 ENCOUNTER — Ambulatory Visit: Admitting: Behavioral Health

## 2023-11-06 ENCOUNTER — Encounter (HOSPITAL_COMMUNITY): Payer: Self-pay

## 2023-11-06 ENCOUNTER — Telehealth: Payer: Self-pay | Admitting: Neurology

## 2023-11-06 ENCOUNTER — Telehealth: Payer: Self-pay

## 2023-11-06 ENCOUNTER — Emergency Department (HOSPITAL_COMMUNITY)

## 2023-11-06 ENCOUNTER — Emergency Department (HOSPITAL_COMMUNITY)
Admission: EM | Admit: 2023-11-06 | Discharge: 2023-11-06 | Disposition: A | Discharge status: Home / Self Care | Source: Ambulatory Visit | Attending: Emergency Medicine | Admitting: Emergency Medicine

## 2023-11-06 ENCOUNTER — Other Ambulatory Visit: Payer: Self-pay

## 2023-11-06 DIAGNOSIS — M5116 Intervertebral disc disorders with radiculopathy, lumbar region: Secondary | ICD-10-CM | POA: Diagnosis not present

## 2023-11-06 DIAGNOSIS — D1809 Hemangioma of other sites: Secondary | ICD-10-CM | POA: Diagnosis not present

## 2023-11-06 DIAGNOSIS — Z7982 Long term (current) use of aspirin: Secondary | ICD-10-CM | POA: Diagnosis not present

## 2023-11-06 DIAGNOSIS — M50121 Cervical disc disorder at C4-C5 level with radiculopathy: Secondary | ICD-10-CM | POA: Diagnosis not present

## 2023-11-06 DIAGNOSIS — I10 Essential (primary) hypertension: Secondary | ICD-10-CM | POA: Insufficient documentation

## 2023-11-06 DIAGNOSIS — M48061 Spinal stenosis, lumbar region without neurogenic claudication: Secondary | ICD-10-CM | POA: Diagnosis not present

## 2023-11-06 DIAGNOSIS — M130 Polyarthritis, unspecified: Secondary | ICD-10-CM | POA: Diagnosis not present

## 2023-11-06 DIAGNOSIS — M791 Myalgia, unspecified site: Secondary | ICD-10-CM | POA: Insufficient documentation

## 2023-11-06 DIAGNOSIS — M4317 Spondylolisthesis, lumbosacral region: Secondary | ICD-10-CM | POA: Diagnosis not present

## 2023-11-06 DIAGNOSIS — M4802 Spinal stenosis, cervical region: Secondary | ICD-10-CM | POA: Diagnosis not present

## 2023-11-06 DIAGNOSIS — M5414 Radiculopathy, thoracic region: Secondary | ICD-10-CM | POA: Diagnosis not present

## 2023-11-06 DIAGNOSIS — M4722 Other spondylosis with radiculopathy, cervical region: Secondary | ICD-10-CM | POA: Diagnosis not present

## 2023-11-06 DIAGNOSIS — M2550 Pain in unspecified joint: Secondary | ICD-10-CM

## 2023-11-06 DIAGNOSIS — Z79899 Other long term (current) drug therapy: Secondary | ICD-10-CM | POA: Diagnosis not present

## 2023-11-06 DIAGNOSIS — M7989 Other specified soft tissue disorders: Secondary | ICD-10-CM | POA: Diagnosis not present

## 2023-11-06 LAB — CBC WITH DIFFERENTIAL/PLATELET
Abs Immature Granulocytes: 0.02 10*3/uL (ref 0.00–0.07)
Basophils Absolute: 0 10*3/uL (ref 0.0–0.1)
Basophils Relative: 0 %
Eosinophils Absolute: 0.1 10*3/uL (ref 0.0–0.5)
Eosinophils Relative: 1 %
HCT: 37.1 % — ABNORMAL LOW (ref 39.0–52.0)
Hemoglobin: 12 g/dL — ABNORMAL LOW (ref 13.0–17.0)
Immature Granulocytes: 0 %
Lymphocytes Relative: 9 %
Lymphs Abs: 0.8 10*3/uL (ref 0.7–4.0)
MCH: 28.7 pg (ref 26.0–34.0)
MCHC: 32.3 g/dL (ref 30.0–36.0)
MCV: 88.8 fL (ref 80.0–100.0)
Monocytes Absolute: 0.6 10*3/uL (ref 0.1–1.0)
Monocytes Relative: 7 %
Neutro Abs: 7 10*3/uL (ref 1.7–7.7)
Neutrophils Relative %: 83 %
Platelets: 351 10*3/uL (ref 150–400)
RBC: 4.18 MIL/uL — ABNORMAL LOW (ref 4.22–5.81)
RDW: 13.2 % (ref 11.5–15.5)
WBC: 8.4 10*3/uL (ref 4.0–10.5)
nRBC: 0 % (ref 0.0–0.2)

## 2023-11-06 LAB — COMPREHENSIVE METABOLIC PANEL WITH GFR
ALT: 18 U/L (ref 0–44)
AST: 16 U/L (ref 15–41)
Albumin: 3.6 g/dL (ref 3.5–5.0)
Alkaline Phosphatase: 99 U/L (ref 38–126)
Anion gap: 10 (ref 5–15)
BUN: 26 mg/dL — ABNORMAL HIGH (ref 8–23)
CO2: 24 mmol/L (ref 22–32)
Calcium: 9.7 mg/dL (ref 8.9–10.3)
Chloride: 104 mmol/L (ref 98–111)
Creatinine, Ser: 1.34 mg/dL — ABNORMAL HIGH (ref 0.61–1.24)
GFR, Estimated: 58 mL/min — ABNORMAL LOW (ref >60)
Glucose, Bld: 116 mg/dL — ABNORMAL HIGH (ref 70–99)
Potassium: 4.1 mmol/L (ref 3.5–5.1)
Sodium: 138 mmol/L (ref 135–145)
Total Bilirubin: 1.2 mg/dL (ref 0.0–1.2)
Total Protein: 8.1 g/dL (ref 6.5–8.1)

## 2023-11-06 LAB — CK: Total CK: 51 U/L (ref 49–397)

## 2023-11-06 MED ORDER — GADOBUTROL 1 MMOL/ML IV SOLN
7.0000 mL | Freq: Once | INTRAVENOUS | Status: AC | PRN
  Administered 2023-11-06: 7 mL via INTRAVENOUS

## 2023-11-06 MED ORDER — TRAMADOL HCL 50 MG PO TABS
50.0000 mg | ORAL_TABLET | Freq: Once | ORAL | Status: AC
  Administered 2023-11-06: 50 mg via ORAL
  Filled 2023-11-06: qty 1

## 2023-11-06 MED ORDER — HYDROCODONE-ACETAMINOPHEN 5-325 MG PO TABS: 1.0000 | ORAL_TABLET | ORAL | 0 refills | Status: DC | PRN

## 2023-11-06 NOTE — Telephone Encounter (Signed)
 I have not talked to them. 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 We cannot fill those out anyway. Please proceed with CSF testing for AD biomarkers. Thanks

## 2023-11-06 NOTE — ED Provider Notes (Signed)
 68 year old male with whole body pain.  Received signout; see prior team's note for full HPI.  Signed out pending MRIs.  Continues to have stable vitals.  MRIs were negative for acute pathology.  Discussed findings with patient and follow-up primary doctor.  Will discharge as planned with Dr. Lenor as otherwise labs, vitals and physical exam all reassuring.  Patient and wife agreeable to plan.  Return precautions given   Neysa Caron PARAS, DO 11/06/23 1718

## 2023-11-06 NOTE — Telephone Encounter (Signed)
 Close encounter

## 2023-11-06 NOTE — Progress Notes (Signed)
 Patient is currently at [facility] cone lumbar spine tap has been ordered awaiting for this to be scheduled.

## 2023-11-06 NOTE — ED Triage Notes (Signed)
 Pt has been seeing a Neurologist for his new altered mental status and pain, MRI scheduled for Monday. Pt is reporting pain all over 10/10 that has been going on for a while.

## 2023-11-06 NOTE — ED Provider Notes (Addendum)
 [facility] AT [facility] Provider Note   CSN: [id number] Arrival date & time: 11/06/23  1011     Patient presents with: Pain   Christian Landry is a 68 y.o. male.   Patient is a 68 year old male who presents with body pain.  He has a history of hypertension, anemia, hyperlipidemia, vitamin B12 deficiency.  He has had some increasing cognitive function.  He is followed by neurology for this.  It was suspected that he has some dementia.  He is currently had some repeat psycho behavioral testing.  He had a recent outpatient MRI of his brain which was ordered by neurology.  He comes in today with his wife who helps provide history.  They state that he has had body pain for about the last 2 to 3 months.  He has been followed by his PCP, Dr. Auston for this.  It was felt that it may be related to his statin and he is stopped his statin.  He saw his PCP earlier in July and was started on Relafen which has not improved his symptoms.  He has pain all over.  He says its from his head to his toes.  He feels weak but not sure if this is from the pain.  He denies any numbness in his extremities.  No difficulty swallowing or eating.  No shortness of breath.  He has had difficulty with ambulation because of the pains.  He says it is both in his joints and in his muscles.  No fevers.  No visible swelling of his joints.  He comes in today because he was having hard time even getting out of bed due to the pain.  His wife indicates that his doctor had ordered a whole-body MRI but they are not sure what specifically they were looking for.  I discussed with Dr. Auston who is his physician with [facility] physicians.  Dr. Auston indicates that he has had the symptoms since about April and he has had evaluation in his office including inflammatory markers which has been negative.  He referred the patient to orthopedics and apparently they ordered the MRIs but he does not see anything in the chart  which would indicate what type of MRI was ordered.       Prior to Admission medications   Medication Sig Start Date End Date Taking? Authorizing Provider  amLODipine (NORVASC) 5 MG tablet amlodipine 5 mg tablet  TAKE 1 TABLET BY MOUTH ONCE DAILY FOR BLOOD PRESSURE    [provider]  Apoaequorin (PREVAGEN PO) Take by mouth.    [provider]  aspirin EC 81 MG tablet 1 tablet    [provider]  azelastine (ASTELIN) 0.1 % nasal spray Place 2 sprays into both nostrils 2 (two) times daily. Patient not taking: Reported on 03/17/2023 08/07/22   [provider]  cyanocobalamin  1000 MCG tablet Take 1,000 mcg by mouth daily.    [provider]  donepezil  (ARICEPT ) 10 MG tablet Take 1 tablet (10 mg total) by mouth at bedtime. 03/17/23   Franchot Harlene SQUIBB, PMHNP  escitalopram  (LEXAPRO ) 10 MG tablet Take 1 tablet (10 mg total) by mouth daily. 04/10/23   Franchot Harlene SQUIBB, PMHNP  ferrous sulfate 324 MG TBEC Take 324 mg by mouth.    [provider]  lisinopril (ZESTRIL) 20 MG tablet lisinopril 20 mg tablet  TAKE 1 TABLET BY MOUTH EVERY DAY FOR BLOOD PRESSURE 08/23/19   [provider]  mirtazapine  (REMERON ) 15 MG tablet Take 1 tablet (15 mg total) by mouth at bedtime. 04/10/23   Franchot Harlene SQUIBB, PMHNP  Multiple Vitamins-Minerals (MULTIVITAMIN ADULT EXTRA C PO) 1 tablet    [provider]  Omega-3 Fatty Acids (FISH OIL) 1000 MG CAPS Take 1,000 mg by mouth daily.    [provider]  sildenafil (VIAGRA) 100 MG tablet SMARTSIG:0.5-1 Tablet(s) By Mouth PRN 12/15/20   [provider]    Allergies: Patient has no known allergies.    Review of Systems  Constitutional:  Negative for fatigue and fever.  HENT:  Negative for congestion, nosebleeds and trouble swallowing.   Respiratory:  Negative for shortness of breath.   Cardiovascular:  Negative for chest pain.  Gastrointestinal:  Negative for abdominal pain, diarrhea,  nausea and vomiting.  Musculoskeletal:  Positive for arthralgias, back pain, myalgias and neck pain.  Skin:  Negative for rash and wound.  Neurological:  Positive for weakness. Negative for seizures and numbness.  Psychiatric/Behavioral:  Positive for confusion.     Updated Vital Signs BP 114/69   Pulse 63   Temp 98.5 F (36.9 C)   Resp 16   Ht 5' 10 (1.778 m)   Wt 74.8 kg   SpO2 98%   BMI 23.68 kg/m   Physical Exam Constitutional:      Appearance: He is well-developed.  HENT:     Head: Normocephalic and atraumatic.  Eyes:     Pupils: Pupils are equal, round, and reactive to light.  Cardiovascular:     Rate and Rhythm: Normal rate and regular rhythm.     Heart sounds: Normal heart sounds.  Pulmonary:     Effort: Pulmonary effort is normal. No respiratory distress.     Breath sounds: Normal breath sounds. No wheezing or rales.  Chest:     Chest wall: No tenderness.  Abdominal:     General: Bowel sounds are normal.     Palpations: Abdomen is soft.     Tenderness: There is no abdominal tenderness. There is no guarding or rebound.  Musculoskeletal:        General: Normal range of motion.     Cervical back: Normal range of motion and neck supple.  Lymphadenopathy:     Cervical: No cervical adenopathy.  Skin:    General: Skin is warm and dry.     Findings: No rash.  Neurological:     Mental Status: He is alert and oriented to person, place, and time.     Comments: Motor 5/5 all extremities Sensation grossly intact to LT all extremities Finger to Nose intact, no pronator drift CN II-XII grossly intact Gait normal      (all labs ordered are listed, but only abnormal results are displayed) Labs Reviewed  COMPREHENSIVE METABOLIC PANEL WITH GFR - Abnormal; Notable for the following components:      Result Value   Glucose, Bld 116 (*)    BUN 26 (*)    Creatinine, Ser 1.34 (*)    GFR, Estimated 58 (*)    All other components within normal limits  CBC WITH  DIFFERENTIAL/PLATELET - Abnormal; Notable for the following components:   RBC 4.18 (*)    Hemoglobin 12.0 (*)    HCT 37.1 (*)    All other components within normal limits  CK    EKG: None  Radiology: No results found.   Procedures   Medications Ordered in the ED  traMADol (ULTRAM) tablet 50 mg (50 mg Oral Given 11/06/23  1128)                                    Medical Decision Making Amount and/or Complexity of Data Reviewed Labs: ordered. Radiology: ordered.  Risk Prescription drug management.   This patient presents to the ED for concern of body pain, this involves an extensive number of treatment options, and is a complaint that carries with it a high risk of complications and morbidity.  I considered the following differential and admission for this acute, potentially life threatening condition.  The differential diagnosis includes myositis, arthritis, autoimmune disorder, radiculopathy, medication effect, rhabdomyolysis  MDM:    Patient is a 68 year old who presents with allover body pain.  I do not see any visible joint swelling.  No neurologic deficits.  He has good strength in his extremities.  He does have some limited movement in raising his arms and legs but I think this is due to his joint pain rather than actual weakness.  He has intact reflexes.  He does not seem to have hyperreflexia.  He does not have weakness that would be more concerning for other etiologies such as myasthenia gravis labs reviewed and are nonconcerning.  No evidence of rhabdomyolysis.  LFTs are normal.  I discussed with Dr. Auston.  Will start some tramadol for the patient.  He has gotten some relief in his symptoms with tramadol here in the ED.  Discussed  waiting an MRI of the spine.  Discussed this with the patient who wants to go ahead and do it while he is here in the ED.  Will order MRI of the cervical, thoracic and lumbar sacral spine.  Will send in a prescription for tramadol and  anticipate if the MRIs do not show any concerning findings, patient will follow-up with Dr. Auston.  Discussed this as well with the patient and his wife.  Care turned over to Dr. Neysa pending MRIs  Tramadol seems to have risk of serotonin syndrome in conjunction with his Remeron .  Will prescribe hydrocodone instead.  (Labs, imaging, consults)  Labs: I Ordered, and personally interpreted labs.  The pertinent results include: Normal LFTs, normal white count normal CK  Imaging Studies ordered: I ordered imaging studies including MR spine  Additional history obtained from wife.  External records from outside source obtained and reviewed including history  Cardiac Monitoring: The patient was not maintained on a cardiac monitor.  If on the cardiac monitor, I personally viewed and interpreted the cardiac monitored which showed an underlying rhythm of:    Reevaluation: After the interventions noted above, I reevaluated the patient and found that they have :improved  Social Determinants of Health:  cognitive decline  Disposition: Pending  Co morbidities that complicate the patient evaluation  Past Medical History:  Diagnosis Date   Adverse effect of drug 10/27/2023   Dietary iron deficiency anemia    Diverticulosis of colon    Erectile dysfunction    Essential hypertension    History of adenomatous polyp of colon    Major depressive disorder    Mild cognitive impairment with memory loss 10/28/2023   Mixed hyperlipidemia    OSA (obstructive sleep apnea) 04/15/2021   HST 08/23/21- AHI 39.8/ hr, desaturation to 82%, body weight 182 lbs     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

## 2023-11-06 NOTE — Telephone Encounter (Signed)
 Per Dr.Aquino, only will do LP void Biomarkers advised to patient.Note patient is in a lot of pain, is going to ER for assessment. Will follow up with patient in a bit.

## 2023-11-06 NOTE — Discharge Instructions (Addendum)
 Please follow-up with your primary doctor.  Return if you have fevers, chills, lightheadedness, passout, chest pain, shortness of breath, unilateral weakness, facial droop, difficulty finding words or any new or worsening symptoms that are concerning to you

## 2023-11-06 NOTE — Telephone Encounter (Signed)
 Pt's wife called in stating Dr. Georjean told them to let her know when they came to pick up paperwork. Dr. Georjean was wanting to talk to them. They are on their way soon and plan to be here in about 45 minutes.

## 2023-11-09 ENCOUNTER — Other Ambulatory Visit: Payer: Self-pay | Admitting: Physician Assistant

## 2023-11-09 DIAGNOSIS — R413 Other amnesia: Secondary | ICD-10-CM

## 2023-11-09 NOTE — Telephone Encounter (Signed)
 Pt.s wife cld to speak with christy, what to do without Wertman

## 2023-11-10 DIAGNOSIS — F0390 Unspecified dementia without behavioral disturbance: Secondary | ICD-10-CM | POA: Diagnosis not present

## 2023-11-10 DIAGNOSIS — E782 Mixed hyperlipidemia: Secondary | ICD-10-CM | POA: Diagnosis not present

## 2023-11-10 DIAGNOSIS — M7918 Myalgia, other site: Secondary | ICD-10-CM | POA: Diagnosis not present

## 2023-11-10 NOTE — Telephone Encounter (Signed)
 Patient wife called and left a message on the VM stating that she called yesterday and needs to speak to someone about orders and has questions  please call   She would like to speak to [facility]

## 2023-11-10 NOTE — Progress Notes (Signed)
 I spoke with patient and she is going to call [facility] to get the LP scheduled at [phone]

## 2023-11-12 ENCOUNTER — Telehealth: Payer: Self-pay | Admitting: Physician Assistant

## 2023-11-12 DIAGNOSIS — M353 Polymyalgia rheumatica: Secondary | ICD-10-CM | POA: Diagnosis not present

## 2023-11-12 NOTE — Telephone Encounter (Signed)
 Left a message with the after hour service on 11-12-23    Caller states that she needs to speak to someone about the patient experiencing aching that is not Muscular and they need to know what to do. He is being referred back to the orthopedic dr Gregory states that he is sch for further testing but he is unable to find where the pain is stemming from   Please call

## 2023-11-12 NOTE — Telephone Encounter (Signed)
 Left message on machine for patient to call back.

## 2023-11-13 NOTE — Telephone Encounter (Signed)
 Pt calling to speak with Renee after she [facility], Christian Landry is in a lot of pain and doctors visited are saying to see us 

## 2023-11-13 NOTE — Telephone Encounter (Signed)
 Pt Called and I reported Dr. Venus results to wife and she was still up set that all the other Doctors are saying it is neurologic and she reported that our office was called by the doctors someone spoke with a doctor here. ( I seen no notes in the chart) about the pt. She has been to the PCP , hospital, and Orthopedic doctor and is tried of the run around. I told her that our doctors are very caring for our patients and they are able to see the MRI that were done in the hospital. I remained her that the Patient is being seen for memory and our doctors doesn't prescribe narcotic pain meds. I told her I spoke with one of the doctors here and she suggested that she go to PCP and talk to them about Pain Management. She was still unhappy and said she was going to take him back to the hospital.

## 2023-11-15 ENCOUNTER — Encounter (HOSPITAL_COMMUNITY): Payer: Self-pay

## 2023-11-15 ENCOUNTER — Ambulatory Visit (HOSPITAL_COMMUNITY)
Admission: EM | Admit: 2023-11-15 | Discharge: 2023-11-15 | Disposition: A | Discharge status: Home / Self Care | Attending: Physician Assistant | Admitting: Physician Assistant

## 2023-11-15 DIAGNOSIS — M2550 Pain in unspecified joint: Secondary | ICD-10-CM | POA: Diagnosis not present

## 2023-11-15 DIAGNOSIS — R6883 Chills (without fever): Secondary | ICD-10-CM | POA: Diagnosis not present

## 2023-11-15 DIAGNOSIS — M791 Myalgia, unspecified site: Secondary | ICD-10-CM | POA: Insufficient documentation

## 2023-11-15 DIAGNOSIS — R202 Paresthesia of skin: Secondary | ICD-10-CM | POA: Insufficient documentation

## 2023-11-15 LAB — COMPREHENSIVE METABOLIC PANEL WITH GFR
ALT: 25 U/L (ref 0–44)
AST: 24 U/L (ref 15–41)
Albumin: 3.1 g/dL — ABNORMAL LOW (ref 3.5–5.0)
Alkaline Phosphatase: 104 U/L (ref 38–126)
Anion gap: 14 (ref 5–15)
BUN: 31 mg/dL — ABNORMAL HIGH (ref 8–23)
CO2: 22 mmol/L (ref 22–32)
Calcium: 9.8 mg/dL (ref 8.9–10.3)
Chloride: 99 mmol/L (ref 98–111)
Creatinine, Ser: 1.76 mg/dL — ABNORMAL HIGH (ref 0.61–1.24)
GFR, Estimated: 42 mL/min — ABNORMAL LOW (ref >60)
Glucose, Bld: 94 mg/dL (ref 70–99)
Potassium: 4.7 mmol/L (ref 3.5–5.1)
Sodium: 135 mmol/L (ref 135–145)
Total Bilirubin: 0.7 mg/dL (ref 0.0–1.2)
Total Protein: 7.2 g/dL (ref 6.5–8.1)

## 2023-11-15 LAB — POCT URINALYSIS DIP (MANUAL ENTRY)
Blood, UA: NEGATIVE
Glucose, UA: NEGATIVE mg/dL
Leukocytes, UA: NEGATIVE
Nitrite, UA: NEGATIVE
Protein Ur, POC: 30 mg/dL — AB
Spec Grav, UA: 1.025 (ref 1.010–1.025)
Urobilinogen, UA: 1 U/dL
pH, UA: 5.5 (ref 5.0–8.0)

## 2023-11-15 LAB — CBC WITH DIFFERENTIAL/PLATELET
Abs Immature Granulocytes: 0.03 K/uL (ref 0.00–0.07)
Basophils Absolute: 0.1 K/uL (ref 0.0–0.1)
Basophils Relative: 1 %
Eosinophils Absolute: 0.1 K/uL (ref 0.0–0.5)
Eosinophils Relative: 1 %
HCT: 32.7 % — ABNORMAL LOW (ref 39.0–52.0)
Hemoglobin: 10.7 g/dL — ABNORMAL LOW (ref 13.0–17.0)
Immature Granulocytes: 0 %
Lymphocytes Relative: 14 %
Lymphs Abs: 1.2 K/uL (ref 0.7–4.0)
MCH: 28.8 pg (ref 26.0–34.0)
MCHC: 32.7 g/dL (ref 30.0–36.0)
MCV: 87.9 fL (ref 80.0–100.0)
Monocytes Absolute: 0.7 K/uL (ref 0.1–1.0)
Monocytes Relative: 9 %
Neutro Abs: 6 K/uL (ref 1.7–7.7)
Neutrophils Relative %: 75 %
Platelets: 447 K/uL — ABNORMAL HIGH (ref 150–400)
RBC: 3.72 MIL/uL — ABNORMAL LOW (ref 4.22–5.81)
RDW: 13.2 % (ref 11.5–15.5)
WBC: 8 K/uL (ref 4.0–10.5)
nRBC: 0 % (ref 0.0–0.2)

## 2023-11-15 LAB — VITAMIN B12: Vitamin B-12: 526 pg/mL (ref 180–914)

## 2023-11-15 LAB — CK: Total CK: 62 U/L (ref 49–397)

## 2023-11-15 LAB — URIC ACID: Uric Acid, Serum: 8.1 mg/dL (ref 3.7–8.6)

## 2023-11-15 MED ORDER — GABAPENTIN 300 MG PO CAPS
300.0000 mg | ORAL_CAPSULE | Freq: Every day | ORAL | 0 refills | Status: DC
Start: 2023-11-15 — End: 2023-11-22

## 2023-11-15 NOTE — ED Provider Notes (Signed)
 [facility]    CSN: [id number] Arrival date & time: 11/15/23  1438      History   Chief Complaint No chief complaint on file.   HPI Christian Landry is a 68 y.o. male.   Patient presents today companied by his wife who provided the majority of history.  Reports that for the past several months he has had intermittent severe myalgias and arthralgias.  He has been seen by multiple providers including orthopedics, neurology, primary care.  At first symptoms were attributed to his statin but they stopped and then changed his medication and continues to have significant symptoms despite this medication change.  He denies any additional medication changes since symptoms began.  He has been seen in the emergency room several times and given hydrocodone  which provides only minimal relief of symptoms.  His last visit to the emergency room was on 11/06/2023 at which point he had MRI of cervical, thoracic, lumbar spine that showed degenerative changes without acute osseous abnormality.  Wife reports that he has had times where the pain is so severe he is doubled over and his muscles involuntarily contract.  He has been unable to sleep because of the symptoms.  He denies any personal or family history of rheumatoid arthritis.  Denies history of gout.  He does report significant numbness and paresthesias in his hand that is very uncomfortable and prevents him from sleeping at night.  He denies diagnosis of carpal tunnel or other neuromuscular condition.  The numbness and tingling involves all of his fingers and not just in a specific dermatome.  His wife is interested in Lyme testing as they have discussed his symptoms with several family members who are in medicine and they think this might be worthwhile.  He did have a tick removed several months ago (they believe around February) but it was small and not engorged so he did not think much of it.  He has a chills has not had any fever.  Denies any chest  pain or shortness of breath.  He is eating and drinking normally.  Patient and wife expressed frustration that his symptoms are debilitating and they have not been able to find any answer.    Past Medical History:  Diagnosis Date   Adverse effect of drug 10/27/2023   Dietary iron deficiency anemia    Diverticulosis of colon    Erectile dysfunction    Essential hypertension    History of adenomatous polyp of colon    Major depressive disorder    Mild cognitive impairment with memory loss 10/28/2023   Mixed hyperlipidemia    OSA (obstructive sleep apnea) 04/15/2021   HST 08/23/21- AHI 39.8/ hr, desaturation to 82%, body weight 182 lbs     REM behavioral disorder 04/15/2021   Sciatica    Snoring 04/15/2021   Vitamin B12 deficiency     Patient Active Problem List   Diagnosis Date Noted   Mild cognitive impairment with memory loss 10/28/2023   Dietary iron deficiency anemia    Mixed hyperlipidemia    Diverticulosis of colon    Erectile dysfunction    History of adenomatous polyp of colon    Essential hypertension    Sciatica    Vitamin B12 deficiency    OSA (obstructive sleep apnea) 04/15/2021   REM behavioral disorder 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 No pertinent surgical history.     Home Medications    Prior to Admission medications   Medication Sig Start Date End  Date Taking? Authorizing Provider  gabapentin  (NEURONTIN ) 300 MG capsule Take 1 capsule (300 mg total) by mouth at bedtime. 11/15/23  Yes Sharisa Toves K, PA-C  amLODipine (NORVASC) 5 MG tablet amlodipine 5 mg tablet  TAKE 1 TABLET BY MOUTH ONCE DAILY FOR BLOOD PRESSURE Patient not taking: Reported on 11/15/2023    [provider]  Apoaequorin (PREVAGEN PO) Take by mouth.    [provider]  aspirin EC 81 MG tablet 1 tablet    [provider]  azelastine (ASTELIN) 0.1 % nasal spray Place 2 sprays into both nostrils 2 (two) times daily. Patient not taking: Reported on 03/17/2023  08/07/22   [provider]  cyanocobalamin  1000 MCG tablet Take 1,000 mcg by mouth daily.    [provider]  donepezil  (ARICEPT ) 10 MG tablet Take 1 tablet (10 mg total) by mouth at bedtime. 03/17/23   Franchot Harlene SQUIBB, PMHNP  escitalopram  (LEXAPRO ) 10 MG tablet Take 1 tablet (10 mg total) by mouth daily. 04/10/23   Franchot Harlene SQUIBB, PMHNP  ferrous sulfate 324 MG TBEC Take 324 mg by mouth.    [provider]  HYDROcodone -acetaminophen  (NORCO/VICODIN) 5-325 MG tablet Take 1-2 tablets by mouth every 4 (four) hours as needed. 11/06/23   Lenor Hollering, MD  lisinopril (ZESTRIL) 20 MG tablet lisinopril 20 mg tablet  TAKE 1 TABLET BY MOUTH EVERY DAY FOR BLOOD PRESSURE 08/23/19   [provider]  mirtazapine  (REMERON ) 15 MG tablet Take 1 tablet (15 mg total) by mouth at bedtime. 04/10/23   Franchot Harlene SQUIBB, PMHNP  Multiple Vitamins-Minerals (MULTIVITAMIN ADULT EXTRA C PO) 1 tablet    [provider]  Omega-3 Fatty Acids (FISH OIL) 1000 MG CAPS Take 1,000 mg by mouth daily.    [provider]  sildenafil (VIAGRA) 100 MG tablet SMARTSIG:0.5-1 Tablet(s) By Mouth PRN 12/15/20   [provider]    Family History Family History  Problem Relation Age of Onset   Dementia Mother    Memory loss Mother    Dementia Father    Memory loss Father    Dementia Maternal Grandmother     Social History Social History   Tobacco Use   Smoking status: Never   Smokeless tobacco: Never  Vaping Use   Vaping status: Never Used  Substance Use Topics   Alcohol use: No   Drug use: No     Allergies   Patient has no known allergies.   Review of Systems Review of Systems  Constitutional:  Positive for activity change, chills and fatigue. Negative for appetite change and fever.  HENT:  Negative for congestion and sore throat.   Respiratory:  Negative for shortness of breath.   Cardiovascular:  Negative for chest pain.  Gastrointestinal:  Negative for  abdominal pain, diarrhea, nausea and vomiting.  Musculoskeletal:  Positive for arthralgias and myalgias.  Skin:  Negative for wound.  Neurological:  Positive for numbness. Negative for weakness.     Physical Exam Triage Vital Signs ED Triage Vitals [11/15/23 1546]  Encounter Vitals Group     BP (!) 101/56     Girls Systolic BP Percentile      Girls Diastolic BP Percentile      Boys Systolic BP Percentile      Boys Diastolic BP Percentile      Pulse Rate 79     Resp 14     Temp 98.6 F (37 C)     Temp Source Oral     SpO2  100 %     Weight      Height      Head Circumference      Peak Flow      Pain Score 10     Pain Loc      Pain Education      Exclude from Growth Chart    No data found.  Updated Vital Signs BP (!) 101/56 (BP Location: Right Arm)   Pulse 79   Temp 98.6 F (37 C) (Oral)   Resp 14   SpO2 100%   Visual Acuity Right Eye Distance:   Left Eye Distance:   Bilateral Distance:    Right Eye Near:   Left Eye Near:    Bilateral Near:     Physical Exam Vitals reviewed.  Constitutional:      General: He is awake.     Appearance: Normal appearance. He is well-developed. He is not ill-appearing.     Comments: Very pleasant male appears stated age in no acute distress sitting comfortably in exam room  HENT:     Head: Normocephalic and atraumatic.  Cardiovascular:     Rate and Rhythm: Normal rate and regular rhythm.     Heart sounds: Normal heart sounds, S1 normal and S2 normal. No murmur heard. Pulmonary:     Effort: Pulmonary effort is normal.     Breath sounds: Normal breath sounds. No stridor. No wheezing, rhonchi or rales.     Comments: Clear to auscultation bilaterally Abdominal:     Palpations: Abdomen is soft.     Tenderness: There is no abdominal tenderness.  Musculoskeletal:     Comments: Strength 4/5 bilateral upper and lower extremities.  Normal pincer and grip strength bilaterally.  Neurological:     Mental Status: He is alert.   Psychiatric:        Behavior: Behavior is cooperative.      UC Treatments / Results  Labs (all labs ordered are listed, but only abnormal results are displayed) Labs Reviewed  CBC WITH DIFFERENTIAL/PLATELET - Abnormal; Notable for the following components:      Result Value   RBC 3.72 (*)    Hemoglobin 10.7 (*)    HCT 32.7 (*)    Platelets 447 (*)    All other components within normal limits  POCT URINALYSIS DIP (MANUAL ENTRY) - Abnormal; Notable for the following components:   Bilirubin, UA small (*)    Ketones, POC UA small (15) (*)    Protein Ur, POC =30 (*)    All other components within normal limits  COMPREHENSIVE METABOLIC PANEL WITH GFR  URIC ACID  RHEUMATOID FACTOR  CYCLIC CITRUL PEPTIDE ANTIBODY, IGG/IGA  LYME DISEASE SEROLOGY W/REFLEX  CK  ANA W/REFLEX IF POSITIVE  VITAMIN B12    EKG   Radiology No results found.  Procedures Procedures (including critical care time)  Medications Ordered in UC Medications - No data to display  Initial Impression / Assessment and Plan / UC Course  I have reviewed the triage vital signs and the nursing notes.  Pertinent labs & imaging results that were available during my care of the patient were reviewed by me and considered in my medical decision making (see chart for details).     Patient is well-appearing, afebrile, nontoxic, nontachycardic, with normal physical exam.  His urine was obtained that showed trace ketones but otherwise no significant abnormalities or evidence of infection.  I did agree to do blood work to investigate potential causes of his symptoms and  so CBC, CMP, B12, ANA, rheumatoid arthritis testing, Lyme disease testing was all obtained and is pending.  We will make additional recommendations based on these laboratory findings.  We are limited in the medications that can be used to help manage his symptoms as he has tried and failed multiple medicines including hydrocodone .  We did consider muscle  relaxer given he reported myalgias but reports he has tried this in the past and it has been ineffective.  When asked what his worst symptom is that is keeping him up at night he reports the paresthesias in his fingers so we will try low-dose gabapentin .  He was given gabapentin  300 mg to be taken just at night and we discussed that this should not be combined with additional medications that are sedating including previously prescribed hydrocodone .  No indication for dose adjustment of the gabapentin  based on his metabolic panel from 11/06/2023 with a creatinine of 1.34 calculated creatinine clearance of 57 mL/min.  Recommended close follow-up with his primary care and specialist as scheduled.  We discussed that if he develops any additional or changing symptoms he needs to be seen immediately.  Patient and wife expressed understanding and agreement with treatment plan.  Final Clinical Impressions(s) / UC Diagnoses   Final diagnoses:  Chills  Polyarthralgia  Paresthesia of both hands  Myalgia     Discharge Instructions      I have called in gabapentin  in the hopes that this will provide some relief of your symptoms and allow you to rest at night.  This is sedating so it should not be combined with additional sedating medication such as hydrocodone .  Take 1 tablet before bed.  I will contact you if any of your blood work is abnormal and changes our treatment plan.  If you develop any worsening symptoms you need to be seen immediately.  Follow-up with your primary care as soon as possible; call them to schedule an appointment tomorrow if possible.  I hope that we are able to find some answers and that you feel better soon.     ED Prescriptions     Medication Sig Dispense Auth. Provider   gabapentin  (NEURONTIN ) 300 MG capsule Take 1 capsule (300 mg total) by mouth at bedtime. 5 capsule Keonia Pasko K, PA-C      PDMP not reviewed this encounter.   Sherrell Rocky POUR, PA-C 11/15/23 2005

## 2023-11-15 NOTE — Discharge Instructions (Addendum)
 I have called in gabapentin  in the hopes that this will provide some relief of your symptoms and allow you to rest at night.  This is sedating so it should not be combined with additional sedating medication such as hydrocodone .  Take 1 tablet before bed.  I will contact you if any of your blood work is abnormal and changes our treatment plan.  If you develop any worsening symptoms you need to be seen immediately.  Follow-up with your primary care as soon as possible; call them to schedule an appointment tomorrow if possible.  I hope that we are able to find some answers and that you feel better soon.

## 2023-11-15 NOTE — ED Notes (Addendum)
 Patient's wife reports that the patient has had body aches for weeks. Patient's wife reports that the patient pulled a small tick off of him in February. Patient is now c/o bilateral finger pain. Wife also reports that the patient has had chills, but no fever. Patient is unable to raise his arms and can not straighten his neck.  Patient has seen a PCP, Ortho physician, and a neurologist for these symptoms. Patient has had an MRI and wife states that everyone keeps referring her to the Neurologist which he is currently seeing for memory issues. Patient was placed on statins and inflammation medication which he finished. Wife states the symptoms worsened after those medications.  Patient's wife is concerned about Lyme's disease

## 2023-11-16 ENCOUNTER — Telehealth: Payer: Self-pay | Admitting: Neurology

## 2023-11-16 ENCOUNTER — Ambulatory Visit (HOSPITAL_COMMUNITY): Payer: Self-pay

## 2023-11-16 NOTE — Telephone Encounter (Signed)
 Pt.s wide cld to let Dr. Georjean know Urgent care and PCP gave Pt. Hydrocodone  and has been told by other sources he shouldn't be taken that, would like a call back to discuss recent trip to Urgent care 11/15/23 (Notes in Epic)

## 2023-11-16 NOTE — Telephone Encounter (Signed)
 Notified pt wife regarding below message, agreed to follow-up on the blood results, and voiced understanding.

## 2023-11-16 NOTE — Telephone Encounter (Signed)
 Pls let wife know I understand that they are frustrated and that he is in a lot of pain. However, after reviewing the Urgent care notes from both visits and MRI results, the whole body pain, joint pains, muscle pains, are not neurological in nature. There is spinal stenosis in his neck and some soft tissue swelling in his lower back, this would be followed by Ortho. Some inflammatory bloodwork done at Urgent Care are still pending, if positive, Rheumatology may be the next step. She can also discuss with PCP if seeing a Pain specialist may be more helpful for him.

## 2023-11-16 NOTE — Telephone Encounter (Signed)
 This patient has significantly worsening kidney function when compared to kidney function measured 10 days ago.  Patient is also more anemic than he was 10 days ago.  His CBC does not reveal any signs that his body is trying to replete his blood cells, which is a concern and possibly related to his acutely worsening kidney function.  His platelet counts are also elevated but I suspect this is more related to an acute inflammatory process.  It is possible these findings are related to his complaints of muscle pain and spasms.  The results of his Lyme disease, ANA, RF and CCP testing are still pending and may shed some more light on what could possibly be going on.    It is also notable that his liver enzymes have increased compared to liver enzyme levels 10 days ago although the levels are not high enough to be of any meaningful concern at this time.  All other test results are normal.    I recommend he reach out to his PCP today to discuss further evaluation of his acutely worsening kidney function and recent increase in blood loss.

## 2023-11-16 NOTE — Telephone Encounter (Signed)
 Pt wife--stated Dr. Margurette (PCP) to cut back Hydrocodone  , need just take gabapentin . Pt wife stated getting frustrated, due to pt not sleeping, having finger pain and taking just gabapentin  is not working. Please advise

## 2023-11-17 LAB — CYCLIC CITRUL PEPTIDE ANTIBODY, IGG/IGA: CCP Antibodies IgG/IgA: 7 U (ref 0–19)

## 2023-11-17 LAB — RHEUMATOID FACTOR: Rheumatoid fact SerPl-aCnc: 11.9 [IU]/mL (ref <14.0)

## 2023-11-17 LAB — LYME DISEASE SEROLOGY W/REFLEX: Lyme Total Antibody EIA: NEGATIVE

## 2023-11-17 LAB — ANA W/REFLEX IF POSITIVE: Anti Nuclear Antibody (ANA): NEGATIVE

## 2023-11-17 NOTE — Telephone Encounter (Signed)
 See other phone note

## 2023-11-18 ENCOUNTER — Ambulatory Visit (HOSPITAL_COMMUNITY)
Admission: RE | Admit: 2023-11-18 | Discharge: 2023-11-18 | Disposition: A | Discharge status: Home / Self Care | Payer: Self-pay | Source: Ambulatory Visit | Attending: Emergency Medicine | Admitting: Emergency Medicine

## 2023-11-18 ENCOUNTER — Encounter (HOSPITAL_COMMUNITY): Payer: Self-pay

## 2023-11-18 ENCOUNTER — Other Ambulatory Visit: Payer: Self-pay

## 2023-11-18 VITALS — BP 110/67 | HR 82 | Temp 97.8°F | Resp 18

## 2023-11-18 DIAGNOSIS — N289 Disorder of kidney and ureter, unspecified: Secondary | ICD-10-CM | POA: Insufficient documentation

## 2023-11-18 DIAGNOSIS — R809 Proteinuria, unspecified: Secondary | ICD-10-CM | POA: Insufficient documentation

## 2023-11-18 DIAGNOSIS — R700 Elevated erythrocyte sedimentation rate: Secondary | ICD-10-CM | POA: Diagnosis not present

## 2023-11-18 DIAGNOSIS — M2550 Pain in unspecified joint: Secondary | ICD-10-CM | POA: Insufficient documentation

## 2023-11-18 DIAGNOSIS — F0390 Unspecified dementia without behavioral disturbance: Secondary | ICD-10-CM | POA: Insufficient documentation

## 2023-11-18 DIAGNOSIS — R710 Precipitous drop in hematocrit: Secondary | ICD-10-CM | POA: Diagnosis present

## 2023-11-18 DIAGNOSIS — R52 Pain, unspecified: Secondary | ICD-10-CM

## 2023-11-18 DIAGNOSIS — R5383 Other fatigue: Secondary | ICD-10-CM | POA: Diagnosis not present

## 2023-11-18 DIAGNOSIS — D649 Anemia, unspecified: Secondary | ICD-10-CM | POA: Insufficient documentation

## 2023-11-18 LAB — MAGNESIUM: Magnesium: 2 mg/dL (ref 1.7–2.4)

## 2023-11-18 LAB — CBC WITH DIFFERENTIAL/PLATELET
Abs Immature Granulocytes: 0.05 K/uL (ref 0.00–0.07)
Basophils Absolute: 0.1 K/uL (ref 0.0–0.1)
Basophils Relative: 1 %
Eosinophils Absolute: 0.1 K/uL (ref 0.0–0.5)
Eosinophils Relative: 1 %
HCT: 34.1 % — ABNORMAL LOW (ref 39.0–52.0)
Hemoglobin: 11 g/dL — ABNORMAL LOW (ref 13.0–17.0)
Immature Granulocytes: 1 %
Lymphocytes Relative: 13 %
Lymphs Abs: 1.2 K/uL (ref 0.7–4.0)
MCH: 28.6 pg (ref 26.0–34.0)
MCHC: 32.3 g/dL (ref 30.0–36.0)
MCV: 88.6 fL (ref 80.0–100.0)
Monocytes Absolute: 0.8 K/uL (ref 0.1–1.0)
Monocytes Relative: 9 %
Neutro Abs: 6.8 K/uL (ref 1.7–7.7)
Neutrophils Relative %: 75 %
Platelets: 493 K/uL — ABNORMAL HIGH (ref 150–400)
RBC: 3.85 MIL/uL — ABNORMAL LOW (ref 4.22–5.81)
RDW: 13.1 % (ref 11.5–15.5)
WBC: 9 K/uL (ref 4.0–10.5)
nRBC: 0 % (ref 0.0–0.2)

## 2023-11-18 LAB — BASIC METABOLIC PANEL WITH GFR
Anion gap: 12 (ref 5–15)
BUN: 29 mg/dL — ABNORMAL HIGH (ref 8–23)
CO2: 25 mmol/L (ref 22–32)
Calcium: 9.4 mg/dL (ref 8.9–10.3)
Chloride: 96 mmol/L — ABNORMAL LOW (ref 98–111)
Creatinine, Ser: 1.25 mg/dL — ABNORMAL HIGH (ref 0.61–1.24)
GFR, Estimated: >60 mL/min (ref >60)
Glucose, Bld: 74 mg/dL (ref 70–99)
Potassium: 4.5 mmol/L (ref 3.5–5.1)
Sodium: 133 mmol/L — ABNORMAL LOW (ref 135–145)

## 2023-11-18 LAB — POCT URINALYSIS DIP (MANUAL ENTRY)
Bilirubin, UA: NEGATIVE
Blood, UA: NEGATIVE
Glucose, UA: NEGATIVE mg/dL
Ketones, POC UA: NEGATIVE mg/dL
Leukocytes, UA: NEGATIVE
Nitrite, UA: NEGATIVE
Protein Ur, POC: NEGATIVE mg/dL
Spec Grav, UA: <=1.005 — AB (ref 1.010–1.025)
Urobilinogen, UA: 0.2 U/dL
pH, UA: 5.5 (ref 5.0–8.0)

## 2023-11-18 LAB — SEDIMENTATION RATE: Sed Rate: 95 mm/h — ABNORMAL HIGH (ref 0–16)

## 2023-11-18 NOTE — ED Triage Notes (Signed)
 Presents for repeat lab work. States continues to have fatigue and generalized pain.

## 2023-11-18 NOTE — Discharge Instructions (Signed)

## 2023-11-18 NOTE — Discharge Instructions (Addendum)
 During your visit today, we repeated your kidney function testing and evaluation for anemia.  We also checked your urine protein level.  The special test that we ordered today is called a serum protein electrophoresis (SPEP).  I have provided you with a handout about SPEP testing.  Based on the symptoms you have been having as well as some of your abnormal lab results are concerning for either multiple myeloma or amyloidosis.  The result of your SPEP testing will indicate whether further testing for these two conditions needs to be done.  I do see that your lumbar puncture is still scheduled for tomorrow.  Please patient advised that if he does get canceled for some reason, it will be rescheduled if it is deemed still necessary.  I have printed out the results of your labs performed on August 10 so you can take them to your primary care provider and have them for your records.  Your urine protein test today was negative which is good news.  Please do not hesitate to reach out to me this coming Friday, Saturday or Sunday if you have any further questions or concerns.  You can contact me at the [facility] location by phone:[phone].  Thank you for visiting [facility] today.

## 2023-11-18 NOTE — ED Provider Notes (Addendum)
 [facility]    CSN: [id number] Arrival date & time: 11/18/23  1627    HISTORY   Chief Complaint  Patient presents with   Follow-up    repeat labs - entered by callback nurse - Entered by patient   HPI Christian Landry is a pleasant, 68 y.o. male who presents to urgent care today. Patient returns to urgent care for follow-up lab work after having had lab work done on August 10 at this location.  Lab results revealed significant worsening of kidney function and precipitous drop in hemoglobin level compared to labs that were drawn at the emergency room on August 1.  Patient has been experiencing acute fatigue and generalized pain that at times is severely debilitating and interfering with his ability to perform regular activities.  Patient is here with his wife today who states she has been aware of this for the past several months but suspects it has been going on much longer because she believes her husband tends to ignore his and tolerate pain very well.  States that her husband suffers from mild dementia, she provides majority of the history during our visit today.  Patient does state that his pain is not consistently bad, some days are better than others.  States initially he felt the pain was just in his bones and felt it was just due to aging.  Patient states that when he began to have pain in his joints and muscles as well is when he finally admitted to his wife what had been going on.  Patient states that activity makes the pain worse and that his pain is often worse at night but is sometimes bad in the morning as well because he has been still all night and he feels very stiff when he wakes up.  Wife states that patient has a lumbar puncture scheduled for tomorrow as previous workup has proved negative until now.  Patient has had MRI of brain and spine which revealed no findings to explain his pain other than straightening of the normal cervical lordosis, mild degenerative changes,  moderate spinal stenosis at C3 3-4 and C4-5, mild spinal canal stenosis at C5-6 and C6-7, multilevel for aminal stenosis, mild flattening of ventricle cervical cord at C4-5 and C5-6 without signal abnormality, small hemangioma in T1 vertebral body, bilateral spondylosis and slight anterolisthesis at L5-S1, mild disc space narrowing and minimal disc bulging at L1-2.  Noncontrast MRI brain was stable compared to images performed in 2022 and considered normal for age.   The history is provided by the patient and the spouse.   Past Medical History:  Diagnosis Date   Adverse effect of drug 10/27/2023   Dietary iron deficiency anemia    Diverticulosis of colon    Erectile dysfunction    Essential hypertension    History of adenomatous polyp of colon    Major depressive disorder    Mild cognitive impairment with memory loss 10/28/2023   Mixed hyperlipidemia    OSA (obstructive sleep apnea) 04/15/2021   HST 08/23/21- AHI 39.8/ hr, desaturation to 82%, body weight 182 lbs     REM behavioral disorder 04/15/2021   Sciatica    Snoring 04/15/2021   Vitamin B12 deficiency    Patient Active Problem List   Diagnosis Date Noted   Mild cognitive impairment with memory loss 10/28/2023   Dietary iron deficiency anemia    Mixed hyperlipidemia    Diverticulosis of colon    Erectile dysfunction    History of  adenomatous polyp of colon    Essential hypertension    Sciatica    Vitamin B12 deficiency    OSA (obstructive sleep apnea) 04/15/2021   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 No pertinent surgical history.  Home Medications    Prior to Admission medications   Medication Sig Start Date End Date Taking? Authorizing Provider  aspirin EC 81 MG tablet 1 tablet   Yes [provider]  cyanocobalamin  1000 MCG tablet Take 1,000 mcg by mouth daily.   Yes [provider]  donepezil  (ARICEPT ) 10 MG tablet Take 1 tablet (10 mg total) by mouth at bedtime. 03/17/23  Yes  Franchot Harlene SQUIBB, PMHNP  escitalopram  (LEXAPRO ) 10 MG tablet Take 1 tablet (10 mg total) by mouth daily. 04/10/23  Yes Franchot Harlene SQUIBB, PMHNP  ferrous sulfate 324 MG TBEC Take 324 mg by mouth.   Yes [provider]  gabapentin  (NEURONTIN ) 300 MG capsule Take 1 capsule (300 mg total) by mouth at bedtime. 11/15/23  Yes Raspet, Erin K, PA-C  mirtazapine  (REMERON ) 15 MG tablet Take 1 tablet (15 mg total) by mouth at bedtime. 04/10/23  Yes Franchot Harlene SQUIBB, PMHNP  Multiple Vitamins-Minerals (MULTIVITAMIN ADULT EXTRA C PO) 1 tablet   Yes [provider]  Apoaequorin (PREVAGEN PO) Take by mouth.    [provider]  lisinopril (ZESTRIL) 20 MG tablet lisinopril 20 mg tablet  TAKE 1 TABLET BY MOUTH EVERY DAY FOR BLOOD PRESSURE 08/23/19   [provider]  sildenafil (VIAGRA) 100 MG tablet SMARTSIG:0.5-1 Tablet(s) By Mouth PRN 12/15/20   [provider]    Family History Family History  Problem Relation Age of Onset   Dementia Mother    Memory loss Mother    Dementia Father    Memory loss Father    Dementia Maternal Grandmother    Social History Social History   Tobacco Use   Smoking status: Never   Smokeless tobacco: Never  Vaping Use   Vaping status: Never Used  Substance Use Topics   Alcohol use: No   Drug use: No   Allergies   Patient has no known allergies.  Review of Systems Review of Systems Pertinent findings revealed after performing a 14 point review of systems has been noted in the history of present illness.  Physical Exam Vital Signs BP 110/67   Pulse 82   Temp 97.8 F (36.6 C) (Oral)   Resp 18   SpO2 95%   No data found.  Physical Exam Vitals reviewed.  Constitutional:      General: He is awake.     Appearance: Normal appearance. He is well-developed. He is not ill-appearing.     Comments: Very pleasant male appears stated age in no acute distress sitting comfortably in exam room  HENT:     Head: Normocephalic and  atraumatic.  Cardiovascular:     Rate and Rhythm: Normal rate and regular rhythm.     Heart sounds: Normal heart sounds, S1 normal and S2 normal. No murmur heard. Pulmonary:     Effort: Pulmonary effort is normal.     Breath sounds: Normal breath sounds. No stridor. No wheezing, rhonchi or rales.     Comments: Clear to auscultation bilaterally Abdominal:     Palpations: Abdomen is soft.     Tenderness: There is no abdominal tenderness.  Musculoskeletal:     Comments: Strength 4/5 bilateral upper and lower extremities.  Normal pincer and grip strength bilaterally.  Neurological:  Mental Status: He is alert.  Psychiatric:        Behavior: Behavior is cooperative.     Visual Acuity Right Eye Distance:   Left Eye Distance:   Bilateral Distance:    Right Eye Near:   Left Eye Near:    Bilateral Near:     UC Couse / Diagnostics / Procedures:     Radiology DG FL GUIDED LUMBAR PUNCTURE Result Date: 11/19/2023 CLINICAL DATA:  Memory impairment. EXAM: DIAGNOSTIC LUMBAR PUNCTURE UNDER FLUOROSCOPIC GUIDANCE COMPARISON:  None Available. FLUOROSCOPY: Radiation Exposure Index (as provided by the fluoroscopic device): 0.40 mGy Kerma PROCEDURE: Informed consent was obtained from the patient prior to the procedure, including potential complications of headache, allergy, and pain. With the patient prone, the lower back was prepped with Betadine. 1% lidocaine was used for local anesthesia. Lumbar puncture was performed at the L3-4 level via a left interlaminar approach using a 3.5 inch 20 gauge needle with return of clear, colorless CSF with an opening pressure of 16 cm water (measured in the left lateral decubitus position). 23.5 of CSF were obtained for laboratory studies. The patient tolerated the procedure well, and there were no apparent complications. IMPRESSION: Successful fluoroscopically-guided lumbar puncture. Electronically Signed   By: Dasie Hamburg M.D.   On: 11/19/2023 15:17     Procedures Procedures (including critical care time) EKG  Pending results:  Labs Reviewed  SEDIMENTATION RATE - Abnormal; Notable for the following components:      Result Value   Sed Rate 95 (*)    All other components within normal limits  CBC WITH DIFFERENTIAL/PLATELET - Abnormal; Notable for the following components:   RBC 3.85 (*)    Hemoglobin 11.0 (*)    HCT 34.1 (*)    Platelets 493 (*)    All other components within normal limits  BASIC METABOLIC PANEL WITH GFR - Abnormal; Notable for the following components:   Sodium 133 (*)    Chloride 96 (*)    BUN 29 (*)    Creatinine, Ser 1.25 (*)    All other components within normal limits  POCT URINALYSIS DIP (MANUAL ENTRY) - Abnormal; Notable for the following components:   Spec Grav, UA <=1.005 (*)    All other components within normal limits  MAGNESIUM  PROTEIN ELECTROPHORESIS, SERUM    Medications Ordered in UC: Medications - No data to display  UC Diagnoses / Final Clinical Impressions(s)   I have reviewed the triage vital signs and the nursing notes.  Pertinent labs & imaging results that were available during my care of the patient were reviewed by me and considered in my medical decision making (see chart for details).    Final diagnoses:  Other fatigue  Polyarthralgia  Renal impairment  Proteinuria, unspecified type  Anemia, unspecified type  Pain   Repeat metabolic panel revealed improvement of renal function but decrease in sodium and chloride levels.  Repeat CBC revealed improvement of hemoglobin level with an increase in platelet count.  Patient also had an elevated sed rate.  Magnesium level, which was evaluated to rule out hypomagnesemia which can cause neuromuscular disturbances, was normal.  SPEP is still pending and was performed to evaluate for monoclonal diseases such as lymphoma, leukemia, multiple myeloma, macroglobulinemia, amyloidosis.  Will notify patient of those results once received and  make sure his primary care provider receives these results.  Patient politely declines offer for further pain management options, states he has been prescribed oxycodone and gabapentin  which do not help  and prefers to continue ibuprofen and Tylenol  as needed.  Please see discharge instructions below for details of plan of care as provided to patient. ED Prescriptions   None    PDMP not reviewed this encounter.  Pending results:  Labs Reviewed  SEDIMENTATION RATE - Abnormal; Notable for the following components:      Result Value   Sed Rate 95 (*)    All other components within normal limits  CBC WITH DIFFERENTIAL/PLATELET - Abnormal; Notable for the following components:   RBC 3.85 (*)    Hemoglobin 11.0 (*)    HCT 34.1 (*)    Platelets 493 (*)    All other components within normal limits  BASIC METABOLIC PANEL WITH GFR - Abnormal; Notable for the following components:   Sodium 133 (*)    Chloride 96 (*)    BUN 29 (*)    Creatinine, Ser 1.25 (*)    All other components within normal limits  POCT URINALYSIS DIP (MANUAL ENTRY) - Abnormal; Notable for the following components:   Spec Grav, UA <=1.005 (*)    All other components within normal limits  MAGNESIUM  PROTEIN ELECTROPHORESIS, SERUM      Discharge Instructions      During your visit today, we repeated your kidney function testing and evaluation for anemia.  We also checked your urine protein level.  The special test that we ordered today is called a serum protein electrophoresis (SPEP).  I have provided you with a handout about SPEP testing.  Based on the symptoms you have been having as well as some of your abnormal lab results are concerning for either multiple myeloma or amyloidosis.  The result of your SPEP testing will indicate whether further testing for these two conditions needs to be done.  I do see that your lumbar puncture is still scheduled for tomorrow.  Please patient advised that if he does get canceled  for some reason, it will be rescheduled if it is deemed still necessary.  I have printed out the results of your labs performed on August 10 so you can take them to your primary care provider and have them for your records.  Your urine protein test today was negative which is good news.  Please do not hesitate to reach out to me this coming Friday, Saturday or Sunday if you have any further questions or concerns.  You can contact me at the [facility] location by phone:[phone].  Thank you for visiting [facility] today.    Disposition Upon Discharge:  Condition: stable for discharge home  Patient presented with an acute illness with associated systemic symptoms and significant discomfort requiring urgent management. In my opinion, this is a condition that a prudent lay person (someone who possesses an average knowledge of health and medicine) may potentially expect to result in complications if not addressed urgently such as respiratory distress, impairment of bodily function or dysfunction of bodily organs.   Routine symptom specific, illness specific and/or disease specific instructions were discussed with the patient and/or caregiver at length.   As such, the patient has been evaluated and assessed, work-up was performed and treatment was provided in alignment with urgent care protocols and evidence based medicine.  Patient/parent/caregiver has been advised that the patient may require follow up for further testing and treatment if the symptoms continue in spite of treatment, as clinically indicated and appropriate.  Patient/parent/caregiver has been advised to return to the [facility] or PCP if no  better; to PCP or the Emergency Department if new signs and symptoms develop, or if the current signs or symptoms continue to change or worsen for further workup, evaluation and treatment as clinically indicated and appropriate  The patient will follow up with their current PCP  if and as advised. If the patient does not currently have a PCP we will assist them in obtaining one.   The patient may need specialty follow up if the symptoms continue, in spite of conservative treatment and management, for further workup, evaluation, consultation and treatment as clinically indicated and appropriate.  Patient/parent/caregiver verbalized understanding and agreement of plan as discussed.  All questions were addressed during visit.  Please see discharge instructions below for further details of plan.  This office note has been dictated using Teaching laboratory technician.  Unfortunately, this method of dictation can sometimes lead to typographical or grammatical errors.  I apologize for your inconvenience in advance if this occurs.  Please do not hesitate to reach out to me if clarification is needed.      Joesph Shaver Scales, PA-C 11/20/23 1113    Joesph Shaver Scales, [place] 01/20/24 1042

## 2023-11-19 ENCOUNTER — Other Ambulatory Visit (HOSPITAL_COMMUNITY)
Admission: RE | Admit: 2023-11-19 | Discharge: 2023-11-19 | Disposition: A | Discharge status: Home / Self Care | Source: Ambulatory Visit | Attending: Physician Assistant | Admitting: Physician Assistant

## 2023-11-19 ENCOUNTER — Ambulatory Visit
Admission: RE | Admit: 2023-11-19 | Discharge: 2023-11-19 | Disposition: A | Discharge status: Home / Self Care | Source: Ambulatory Visit | Attending: Physician Assistant | Admitting: Physician Assistant

## 2023-11-19 DIAGNOSIS — R413 Other amnesia: Secondary | ICD-10-CM | POA: Diagnosis not present

## 2023-11-19 NOTE — Progress Notes (Signed)
1 vial of blood drawn from pts RAC to be sent off with LP lab work. 1 successful attempt, pt tolerated well. Gauze and tape applied after.  

## 2023-11-20 ENCOUNTER — Ambulatory Visit: Payer: Self-pay | Admitting: Emergency Medicine

## 2023-11-20 NOTE — Progress Notes (Signed)
 Mag level is normal. Kidney function has improved from previous but sodium level has dropped.  Hemoglobin level improved but anemia is still present and platelets are still increasing. Sedimentation rate is elevated which is consistent with elevated platelets due to inflammatory response. The SPEP result is still pending.

## 2023-11-21 ENCOUNTER — Telehealth (HOSPITAL_COMMUNITY): Payer: Self-pay | Admitting: *Deleted

## 2023-11-21 NOTE — Telephone Encounter (Signed)
 Secure chat with provider who stated she would call in refill for pt.

## 2023-11-22 LAB — CYTOLOGY - NON PAP

## 2023-11-22 MED ORDER — GABAPENTIN 300 MG PO CAPS: 300.0000 mg | ORAL_CAPSULE | Freq: Three times a day (TID) | ORAL | 0 refills | Status: DC

## 2023-11-22 NOTE — Telephone Encounter (Signed)
 Informed by front desk staff: Christian Landry, dob 08/15/1955, [phone], here last week, stated a RX was never called in, please call (stated called and left message yesterday no answer).  Patient wife informed that medication has been re-sent to [facility] on [address] and received confirmation that the medication has gone through this morning. Patient spouse verbalized understanding.

## 2023-11-23 DIAGNOSIS — N179 Acute kidney failure, unspecified: Secondary | ICD-10-CM | POA: Diagnosis not present

## 2023-11-23 DIAGNOSIS — M7918 Myalgia, other site: Secondary | ICD-10-CM | POA: Diagnosis not present

## 2023-11-23 DIAGNOSIS — F0390 Unspecified dementia without behavioral disturbance: Secondary | ICD-10-CM | POA: Diagnosis not present

## 2023-11-23 DIAGNOSIS — R509 Fever, unspecified: Secondary | ICD-10-CM | POA: Diagnosis not present

## 2023-11-23 DIAGNOSIS — D649 Anemia, unspecified: Secondary | ICD-10-CM | POA: Diagnosis not present

## 2023-11-23 LAB — PROTEIN ELECTROPHORESIS, SERUM
A/G Ratio: 0.8 (ref 0.7–1.7)
Albumin ELP: 2.8 g/dL — ABNORMAL LOW (ref 2.9–4.4)
Alpha-1-Globulin: 0.4 g/dL (ref 0.0–0.4)
Alpha-2-Globulin: 1.1 g/dL — ABNORMAL HIGH (ref 0.4–1.0)
Beta Globulin: 1.3 g/dL (ref 0.7–1.3)
Gamma Globulin: 0.8 g/dL (ref 0.4–1.8)
Globulin, Total: 3.6 g/dL (ref 2.2–3.9)
Total Protein ELP: 6.4 g/dL (ref 6.0–8.5)

## 2023-11-24 NOTE — Telephone Encounter (Signed)
 Please advise patient that his SPEP was abnormal.  This result means that he will need further evaluation.  Please advise patient to reach out to his primary care provider for further recommendations.

## 2023-11-25 ENCOUNTER — Other Ambulatory Visit: Payer: Self-pay

## 2023-11-25 DIAGNOSIS — R413 Other amnesia: Secondary | ICD-10-CM

## 2023-11-25 DIAGNOSIS — G3184 Mild cognitive impairment, so stated: Secondary | ICD-10-CM

## 2023-11-26 ENCOUNTER — Telehealth: Payer: Self-pay

## 2023-11-26 ENCOUNTER — Telehealth: Payer: Self-pay | Admitting: Neurology

## 2023-11-26 NOTE — Telephone Encounter (Signed)
 Left a message with the after hour service on 11-25-23 at 4:57 pm  Caller states that she is from [facility] and calling to speak to Christian Landry about a test for this patient. She declined to leave a message and will call back   They did not get the phone to call her back

## 2023-11-26 NOTE — Telephone Encounter (Signed)
 Call from Br[facility] at [facility] stated that she has results. Provided our office fax number and she will be sending results over for Dr. Georjean to review.

## 2023-11-26 NOTE — Telephone Encounter (Signed)
 [facility] [phone]. Sample icepacks were warm and left out and the sample void. . Can request offline peer to peer to discuss of this. Please advise if you want to pursue this before I call patient to do labs, let me know.  Verneita Schmitz is the contact at [facility].

## 2023-11-26 NOTE — Telephone Encounter (Signed)
 Even if we do peer to peer, it will not retrieve the sample that was voided, so it won't change what we need to do. Can you pls call the wife and let her know that we are also very frustrated with how the Alzheimer's panel on his spinal fluid was unable to be processed. I think for now, we can do the blood test for Alzheimer's as long as she understands that it is not as accurate as the spinal fluid. If blood test is pretty clear, then we can go by that, but if blood test does not provide clear answer, we may need to repeat the spinal tap unfortunately. Thank you

## 2023-11-26 NOTE — Telephone Encounter (Signed)
 Christian Landry from [facility] called  in today and stated that she needed to speak to someone due to they could not complete the Spinal Fluid test on pt. Teresa's  number is [phone]. Thanks

## 2023-11-26 NOTE — Telephone Encounter (Signed)
 I tried to call will have to call back.

## 2023-11-26 NOTE — Telephone Encounter (Signed)
 Pls let her know the results sent back are the paraneoplastic panel. We are looking for the Alzheimer's (ADEVL) panel.

## 2023-11-26 NOTE — Telephone Encounter (Signed)
 I left message to call the office regarding this. To explain what happened before I contact the patient.

## 2023-11-27 NOTE — Telephone Encounter (Signed)
 No answer phone just rang, will send my chart message to call office.

## 2023-11-28 LAB — MAYO MISC ORDER
PRICE:: 0
PRICE:: 1740.8

## 2023-11-30 ENCOUNTER — Telehealth: Payer: Self-pay | Admitting: Physician Assistant

## 2023-11-30 ENCOUNTER — Other Ambulatory Visit

## 2023-11-30 DIAGNOSIS — G4733 Obstructive sleep apnea (adult) (pediatric): Secondary | ICD-10-CM | POA: Diagnosis not present

## 2023-11-30 NOTE — Telephone Encounter (Signed)
 I have spoke to [facility] and patient is coming in today to do [employer]

## 2023-11-30 NOTE — Telephone Encounter (Signed)
 I spoke with patient and he is coming in to get labs today.

## 2023-11-30 NOTE — Telephone Encounter (Signed)
 Tamera with [facility] called and LM with AN. She states there are no critical labs noted at this time, only abnormal. Denies STAT order. States it was listed as a call order which she reported was to be called in once the labs resulted.  Ref# [id number]

## 2023-12-01 DIAGNOSIS — D649 Anemia, unspecified: Secondary | ICD-10-CM | POA: Diagnosis not present

## 2023-12-01 DIAGNOSIS — N179 Acute kidney failure, unspecified: Secondary | ICD-10-CM | POA: Diagnosis not present

## 2023-12-01 DIAGNOSIS — R509 Fever, unspecified: Secondary | ICD-10-CM | POA: Diagnosis not present

## 2023-12-01 DIAGNOSIS — M7918 Myalgia, other site: Secondary | ICD-10-CM | POA: Diagnosis not present

## 2023-12-08 LAB — QUEST AD-DETECT® PHOSPHORYLATED TAU181(P-TAU181), PLASMA: QUEST AD DETECT PTAU181, PLASMA: 1.73 pg/mL — ABNORMAL HIGH (ref <=1.07)

## 2023-12-08 LAB — QUEST AD-DETECT™, BETA-AMYLOID 42/40 RATIO, PLASMA
ABETA 40: 337 pg/mL
ABETA 42/40 RATIO: 0.172 (ref >=0.170)
ABETA 42: 58 pg/mL

## 2023-12-08 LAB — QUEST AD-DETECT PHOSPHORYLATED TAU217(P-TAU217), PLASMA: Quest Detect PTAU217, Plasma: 0.39 pg/mL — ABNORMAL HIGH (ref <=0.15)

## 2023-12-11 DIAGNOSIS — M353 Polymyalgia rheumatica: Secondary | ICD-10-CM | POA: Diagnosis not present

## 2023-12-18 DIAGNOSIS — Z86018 Personal history of other benign neoplasm: Secondary | ICD-10-CM | POA: Diagnosis not present

## 2023-12-18 DIAGNOSIS — R413 Other amnesia: Secondary | ICD-10-CM | POA: Diagnosis not present

## 2023-12-18 DIAGNOSIS — D649 Anemia, unspecified: Secondary | ICD-10-CM | POA: Diagnosis not present

## 2023-12-19 LAB — MULTIPLE SCLEROSIS PANEL 2
Albumin Serum: 3.3 g/dL — ABNORMAL LOW (ref 3.6–5.1)
Albumin, CSF: 33 mg/dL (ref 8.0–42.0)
CNS-IgG Synthesis Rate: -5 mg/(24.h) (ref <=3.3)
IgG (Immunoglobin G), Serum: 798 mg/dL (ref 600–1540)
IgG Total CSF: 3.1 mg/dL (ref 0.8–7.7)
IgG-Index: 0.39 (ref <0.70)
Myelin Basic Protein: <2 ug/L (ref <=4.0)
Oligo Bands: ABSENT

## 2023-12-19 LAB — FUNGUS CULTURE W SMEAR
CULTURE:: NO GROWTH
MICRO NUMBER:: 16831875
SMEAR:: NONE SEEN
SPECIMEN QUALITY:: ADEQUATE

## 2023-12-19 LAB — CSF CELL COUNT WITH DIFFERENTIAL
RBC Count, CSF: 0 {cells}/uL
TOTAL NUCLEATED CELL: 0 {cells}/uL (ref 0–5)

## 2023-12-19 LAB — CSF CULTURE W GRAM STAIN
MICRO NUMBER:: 16831876
Result:: NO GROWTH
SPECIMEN QUALITY:: ADEQUATE

## 2023-12-19 LAB — WEST NILE AB, IGG AND IGM, CSF
West Nile Ab, IgG, CSF: <1.3 {index} (ref <1.30)
West Nile Ab, IgM, CSF: <0.9 {index} (ref <0.90)

## 2023-12-19 LAB — PROTEIN, CSF 14-3-3 (PRION DISEASE)
14-3-3 PROTEIN (CSF)++: 7760 [AU]/ml — ABNORMAL HIGH (ref 173–1999)
EST PROB PRION DIS IN PATIENT: <1 %
RT-QUIC (CSF)*: NEGATIVE
T-TAU PROTEIN (CSF)++: 1124 pg/mL (ref 0–1149)

## 2023-12-19 LAB — PROTEIN, CSF: Total Protein, CSF: 64 mg/dL — ABNORMAL HIGH (ref 15–60)

## 2023-12-19 LAB — CRYPTOCOCCAL AG, LTX SCR RFLX TITER
Cryptococcal Ag Screen: NOT DETECTED
MICRO NUMBER:: 16831874
SPECIMEN QUALITY:: ADEQUATE

## 2023-12-19 LAB — GLUCOSE, CSF: Glucose, CSF: 64 mg/dL (ref 40–80)

## 2023-12-19 LAB — ANGIOTENSIN CONVERTING ENZYME, CSF: ANGIOTENSIN CONVERTING ENZYME ( ACE) CSF: 7 U/L (ref <=15)

## 2023-12-19 LAB — VDRL, CSF: VDRL Quant, CSF: NONREACTIVE

## 2023-12-28 ENCOUNTER — Encounter: Payer: Self-pay | Admitting: Neurology

## 2023-12-28 ENCOUNTER — Ambulatory Visit: Admitting: Neurology

## 2023-12-28 VITALS — BP 115/65 | HR 87 | Resp 18 | Ht 70.0 in | Wt 177.0 lb

## 2023-12-28 DIAGNOSIS — R413 Other amnesia: Secondary | ICD-10-CM

## 2023-12-28 DIAGNOSIS — G3184 Mild cognitive impairment, so stated: Secondary | ICD-10-CM

## 2023-12-28 MED ORDER — DONEPEZIL HCL 10 MG PO TABS: 10.0000 mg | ORAL_TABLET | Freq: Every day | ORAL | 3 refills | Status: AC

## 2023-12-28 NOTE — Patient Instructions (Addendum)
 Good to meet you. Today we discussed the diagnosis of Mild Cognitive Impairment. The concern is that the cause of the Mild Cognitive Impairment is Alzheimer's disease. The spinal tap unfortunately had inadequate specimen once the [facility] clinic received it so they could not complete the spinal fluid test for Alzheimer's disease. The blood test we did showed an increased risk for Alzheimer's.   I would like to do a PET scan to further evaluate the concern for Alzheimer's disease. I would also like to send a referral to the [facility] for further evaluation and management.  Follow-up with Christian Landry in 3 months, call for any changes.   FALL PRECAUTIONS: Be cautious when walking. Scan the area for obstacles that may increase the risk of trips and falls. When getting up in the mornings, sit up at the edge of the bed for a few minutes before getting out of bed. Consider elevating the bed at the head end to avoid drop of blood pressure when getting up. Walk always in a well-lit room (use night lights in the walls). Avoid area rugs or power cords from appliances in the middle of the walkways. Use a walker or a cane if necessary and consider physical therapy for balance exercise. Get your eyesight checked regularly.  FINANCIAL OVERSIGHT: Supervision, especially oversight when making financial decisions or transactions is also recommended.  HOME SAFETY: Consider the safety of the kitchen when operating appliances like stoves, microwave oven, and blender. Consider having supervision and share cooking responsibilities until no longer able to participate in those. Accidents with firearms and other hazards in the house should be identified and addressed as well.  DRIVING: Regarding driving, in patients with progressive memory problems, driving will be impaired. We advise to have someone else do the driving if trouble finding directions or if minor accidents are reported. Independent driving  assessment is available to determine safety of driving.  ABILITY TO BE LEFT ALONE: If patient is unable to contact 911 operator, consider using LifeLine, or when the need is there, arrange for someone to stay with patients. Smoking is a fire hazard, consider supervision or cessation. Risk of wandering should be assessed by caregiver and if detected at any point, supervision and safe proof recommendations should be instituted.  MEDICATION SUPERVISION: Inability to self-administer medication needs to be constantly addressed. Implement a mechanism to ensure safe administration of the medications.  RECOMMENDATIONS FOR ALL PATIENTS WITH MEMORY PROBLEMS: 1. Continue to exercise (Recommend 30 minutes of walking everyday, or 3 hours every week) 2. Increase social interactions - continue going to [place] and enjoy social gatherings with friends and family 3. Eat healthy, avoid fried foods and eat more fruits and vegetables 4. Maintain adequate blood pressure, blood sugar, and blood cholesterol level. Reducing the risk of stroke and cardiovascular disease also helps promoting better memory. 5. Avoid stressful situations. Live a simple life and avoid aggravations. Organize your time and prepare for the next day in anticipation. 6. Sleep well, avoid any interruptions of sleep and avoid any distractions in the bedroom that may interfere with adequate sleep quality 7. Avoid sugar, avoid sweets as there is a strong link between excessive sugar intake, diabetes, and cognitive impairment We discussed the Mediterranean diet, which has been shown to help patients reduce the risk of progressive memory disorders and reduces cardiovascular risk. This includes eating fish, eat fruits and green leafy vegetables, nuts like almonds and hazelnuts, walnuts, and also use olive oil. Avoid fast foods and fried foods  as much as possible. Avoid sweets and sugar as sugar use has been linked to worsening of memory function.  There is  always a concern of gradual progression of memory problems. If this is the case, then we may need to adjust level of care according to patient needs. Support, both to the patient and caregiver, should then be put into place.

## 2023-12-28 NOTE — Progress Notes (Unsigned)
 NEUROLOGY FOLLOW UP OFFICE NOTE  Christian Landry [id number] [birth year]  HISTORY OF PRESENT ILLNESS: I had the pleasure of seeing Christian Landry in follow-up in the neurology clinic on 12/28/2023. He is alone in the office today. The patient was last seen by Memory Disorders PA Christian Landry in July 2025. He presents today to discuss results of further evaluation for memory impairment, they reported worsening memory, he was inattentive during the visit. Records and images were personally reviewed where available. I personally reviewed MRI brain without contrast done 10/2023 which did not show any acute changes, there was mild diffuse atrophy. He had repeat Neuropsychological testing done 10/2023 which showed decline in verbal memory and complex attention, with progression from normal testing in 2023 to Mild Neurocognitive disorder in 10/2023. Etiology is uncertain, there is a potential for an evolving storage impairment as seen with Alzheimer's disease, and if indeed present, it would appear to be in very early stages at present time. On his last testing in 2023, the potential for behavioral variant of frontotemporal dementia (bvFTD) was discussed due to wife reporting he was exhibiting very severe personality changes including aggression and disinhibition, that he denied during both sessions. Continued medical monitoring was recommended.   A lumbar puncture was done for CSF AD biomarkers, however unfortunately there were technical difficulties with specimen exceeding stability for test requested on arrival to the [facility] lab.  He had serum biomarkers done with normal Abeta 42/40 ratio and elevated pTau217 and pTau181.   He is alone today, he reports his wife is dealing with her own medical issues and unable to come. We tried to call her to join the visit via phone but could not reach her. He lives with his wife. He drove to the office and denies getting lost driving. Family fixes his pillbox and he takes it by himself,  denies missing medications. He denies missing bill payments. He was dealing with a lot of diffuse body pain last month and had seen Ortho and his PCP, diagnosed with polymyalgia rheumatica. He reports he is feeling better since prednisone was started, no more pain except his fingers hurt sometimes. He denies any mood changes such as irritability, I'm easygoing. He states his wife feels bad so she is irritable. His mother had Alzheimer's disease.   History on Initial Assessment by Christian Sevin PA-C on 03/11/2021: The patient is seen in neurologic consultation at the request of Christian Charleston, MD for the evaluation of memory.  The patient is accompanied by his wife who supplements the history. This is a 68 y.o. year old male who has had gradual memory issues for about 2 or 3 years, worse over the last year.  His wife noticed that the symptoms are worse over the last year.  She states that he is overloaded with work, he is participating in [employer], and despite being very organized, over the last year he has been having trouble with organization.  He usually keeps a pocket calendar, but he has been misplacing it, which creates some frustration.  He has to write it in a piece of paper otherwise he becomes very frustrated .  She also reports that he is more confused, cannot complete a task, has trouble finding objects, and becomes overloaded .  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  She has a history of anxiety, and this creates increased anxiety in her and in the relationship according to her.  He may go to the  basement and can find something very quickly, but he cannot remember if he lost something in the first place unless he speaks out loud .  He likes to cook, denies leaving the stove on.  His wife states that he has lost recipes that usually are in the collection of recipes in the pantry.  She states that his response will be I do not remember that I was looking for  them . He puts his clean laundry in the  basket and rolls  the socks in a specific way but lately he has  randomly tied them in a knot  which was uncharacteristic. He does asked the same questions and repeats the same stories. He continues to drive, and recently, they had not slept for about 2 days due to their son's wedding, which increased stress during that time, and upon turning into the driveway, he lost direction of the car.  As mentioned earlier, he reports this is not related to confusion, it was a lack of sleep .  Of note, he reports that goes to sleep at midnight, may wake up between 2 and 3 in the morning, I only sleep about 3 hours and go to the office at 6:30 AM .  His wife states that he snores loudly.  He has never been tested for sleep apnea.   He denies a history of depression, but he does report that he is sad  that  this is making her sad. He denies hallucinations or paranoia, no hygiene concerns are noted, he is independent of bathing and dressing.  He does not miss any medication doses, and he is independent on finances, does not forget to pay any bills.  His appetite is normal, denies trouble swallowing.  He ambulates without assistance of cane or walker, denies any recent falls.  Years ago, he had a branch of a tree falling into his forehead, leaving a scar, but he denies that this was deep, he reports that the injury was superficial, no loss of consciousness.Denies headaches, double vision, dizziness, focal numbness or tingling, unilateral weakness or tremors or anosmia. No history of seizures. Denies urine incontinence, retention, constipation or diarrhea.  Denies ETOH or Tobacco. Family History remarkable for both parents with dementia, and one  maternal grand aunt with dementia possibly Alzheimer's.  Diagnostic Data: MRI brain without contrast 11/2023 no acute changes, cerebral volume stable since 2022, within normal limits for age.   Neuropsychological evaluation 08/2021:   Neuropsychological functioning within normal limits. Perceptions of his functioning held by himself and his wife (who was not present during the current interview) appear extremely disparate from one another. While he essentially denied all cognitive difficulties, personality changes, behavioral concerns, and REM sleep behaviors, his wife has noted ongoing cognitive decline, described his behavior as erratic, noted ongoing personality changes to the point where she has seemed fearful, and described significant REM sleep behaviors. Given these disputing reports, I am unable to determine which is the more accurate presentation. There certainly remains the potential that Mr. Klemens attempted to present himself in a favorable light during the current interview and actively denied all concerns. This also appears to be consistent throughout much of his medical records as his wife commonly reports far greater concerns.    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 Behavioral disinhibition and personality changes are the hallmark characteristic of  this illness and his age is certainly within the expected range when this illness most commonly presents itself. Cognitive dysfunction can be minimal or even completely absent across objective testing when in earlier stages, meaning that currently benign testing results would not exclude this illness from being present. This illness remains plausible at the present time and continued medical monitoring will be important moving forward.    Repeat Neuropsychological evaluation 10/2023:  Mild Neurocognitive Disorder (mild cognitive impairment).  The pattern of performance is suggestive of impairment surrounding complex attention and delayed retrieval aspects of verbal memory. Further variability was  exhibited across both encoding (i.e., learning) and recognition/consolidation aspects of verbal memory. Performances were appropriate relative to age-matched peers across all other assessed domains. This includes processing speed, basic attention, executive functioning, safety/judgment, receptive and expressive language, visuospatial abilities, and all aspects of visual learning and memory.   Relative to his previous evaluation in May 2023, noteworthy decline was exhibited across verbal memory and complex attention. Outside of this, all other assessed domains exhibited stability over time.    The etiology for ongoing memory decline is uncertain. Progressive memory decline does increase concern for an underlying neurodegenerative illness. Across verbal memory testing, Mr. Helsley was fully amnestic (i.e., 0% retention) across 2/3 tasks and only exhibited 27% retention across the remaining task. This certainly raises concern for rapid forgetting. This, when combined with variable benefit from cueing and the potential for an evolving storage impairment, could elevate concerns for Alzheimer's disease. There is also a known family history of this illness. It remains encouraging that visual memory has remained stable and intact, as has all non-memory domains commonly implicated by this illness. If Alzheimer's disease is indeed present, it would appear to be in very early stages at the present time.    The potential for the behavioral variant of frontotemporal dementia (bvFTD) was discussed at the time of his previous evaluation. This was primarily brought on due to his wife's reporting of Mr. Helminiak exhibiting very severe personality changes including aggression and disinhibition. He denied any personality changes currently, as well as back in 2023. Given his continued denial and there being no collateral source of information during the present interview, I remain unable to validate the true extent of personality  changes. Executive functioning and language abilities remained stable and appropriate across the current evaluation and an amnestic rapid forgetting memory profile is typically more associated with Alzheimer's disease than bvFTD in early stages of either illness. Testing may favor the former at the present time. Continued medical monitoring will be important moving forward.  Lumbar puncture 11/2023:  normal CSF cell count, protein 64 CSF Protein 14-3-3: 7,760 (ref [phone]) RT-QUIC: negative CSF [facility] paraneoplastic panel negative Unfortunately unable to perform CSF AD biomarkers: the specimen exceeds stability for the test requested  Serum biomarkers done at [facility] 11/2023: Abeta 42/40 ratio: 0.172 (lower risk) eUjl782: 0.39 (ref <0.15) pTau181: 1.73 (ref<1.07)     PAST MEDICAL HISTORY: Past Medical History:  Diagnosis Date   Adverse effect of drug 10/27/2023   Dietary iron deficiency anemia    Diverticulosis of colon    Erectile dysfunction    Essential hypertension    History of adenomatous polyp of colon    Major depressive disorder    Mild cognitive impairment with memory loss 10/28/2023   Mixed hyperlipidemia    OSA (obstructive sleep apnea) 04/15/2021   HST 08/23/21- AHI 39.8/ hr, desaturation to 82%, body weight 182 lbs     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 90 tablet 1   escitalopram  (LEXAPRO ) 10 MG tablet Take 1 tablet (10 mg total) by mouth daily. 90 tablet 0   ferrous sulfate 324 MG TBEC Take 324 mg by mouth.     gabapentin  (NEURONTIN ) 300 MG capsule Take 1 capsule (300 mg total) by mouth 3 (three) times daily. 90 capsule 0   lisinopril (ZESTRIL) 20 MG tablet lisinopril 20 mg  tablet  TAKE 1 TABLET BY MOUTH EVERY DAY FOR BLOOD PRESSURE     mirtazapine  (REMERON ) 15 MG tablet Take 1 tablet (15 mg total) by mouth at bedtime. 90 tablet 1   Multiple Vitamins-Minerals (MULTIVITAMIN ADULT EXTRA C PO) 1 tablet     sildenafil (VIAGRA) 100 MG tablet SMARTSIG:0.5-1 Tablet(s) By Mouth PRN     Apoaequorin (PREVAGEN PO) Take by mouth.     cyanocobalamin  1000 MCG tablet Take 1,000 mcg by mouth daily. (Patient not taking: Reported on 12/28/2023)     No current facility-administered medications on file prior to visit.    ALLERGIES: No Known Allergies  FAMILY HISTORY: Family History  Problem Relation Age of Onset   Dementia Mother    Memory loss Mother    Dementia Father    Memory loss Father    Dementia Maternal Grandmother     SOCIAL HISTORY: Social History   Socioeconomic History   Marital status: Married    Spouse name: Not on file   Number of children: 1   Years of education: 14   Highest education level: Associate degree: academic program  Occupational History   Occupation: Retired    Comment: Warden/ranger; part-time Psychologist, sport and exercise  Tobacco Use   Smoking status: Never   Smokeless tobacco: Never  Advertising account planner   Vaping status: Never Used  Substance and Sexual Activity   Alcohol use: No   Drug use: No   Sexual activity: Not on file  Other Topics Concern   Not on file  Social History Narrative   Right handed   Drinks caffeine   Two story home   Social Drivers of Health   Financial Resource Strain: Not on file  Food Insecurity: Not on file  Transportation Needs: Not on file  Physical Activity: Not on file  Stress: Not on file  Social Connections: Not on file  Intimate Partner Violence: Not on file     PHYSICAL EXAM: Vitals:   12/28/23 1313  BP: 115/65  Pulse: 87  Resp: 18  SpO2: 96%   General: No acute distress Head:  Normocephalic/atraumatic Skin/Extremities: No rash, no edema Neurological Exam: alert and awake. No aphasia or  dysarthria. Fund of knowledge is appropriate. Attention and concentration are normal, he has a notepad and writes down our conversation today.  Cranial nerves: Pupils equal, round. Extraocular movements intact. No facial asymmetry.  Motor: Bulk and tone normal, muscle strength 5/5 throughout with no pronator drift.   Finger to nose testing intact.  Gait narrow-based and steady, able to tandem walk adequately.  Romberg negative.   IMPRESSION: This is a 68 year old right-handed man with a history of anemia, B12 deficiency, hypertension, hyperlipidemia, with Mild Neurocognitive disorder (MCI) on recent Neuropsychological  testing. He presents today to discuss test results. MRI brain no acute changes, there is mild diffuse atrophy. We discussed Neuropsych results showing decline compared to prior testing, etiology remains unclear, there is a potential for an evolving storage impairment as seen with Alzheimer's disease, and if indeed present, it would appear to be in very early stages at present time. On his last testing in 2023, the potential for behavioral variant of frontotemporal dementia (bvFTD) was discussed due to wife reporting he was exhibiting very severe personality changes including aggression and disinhibition, that he denied during both sessions. Continued medical monitoring was recommended. We discussed how the CSF was unfortunately unable to be tested for CSF AD biomarkers. [facility] serum biomarkers showed normal Abeta 42/40 ratio and elevated pTau217 and pTau181, still making diagnosis equivocal. We discussed doing a PET scan to further differentiate between concern for AD and FTD. We discussed referral to [facility] for evaluation if he is a candidate for available infusions for MCI due to AD. All his questions were answered. There was no family present today to corroborate history, continue to closely monitor driving. Continue Donepezil  10mg  daily. Follow-up with Memory Disorders PA Christian Landry in 3 months, call for any changes.     Thank you for allowing me to participate in his care.  Please do not hesitate to call for any questions or concerns.  The duration of this appointment visit was 38 minutes of face-to-face time with the patient.  Greater than 50% of this time was spent in counseling, explanation of diagnosis, planning of further management, and coordination of care.   Darice Shivers, M.D.   CC: Dr. Auston

## 2023-12-30 ENCOUNTER — Encounter: Payer: Self-pay | Admitting: Neurology

## 2024-01-08 ENCOUNTER — Encounter (HOSPITAL_COMMUNITY)
Admission: RE | Admit: 2024-01-08 | Discharge: 2024-01-08 | Disposition: A | Discharge status: Home / Self Care | Source: Ambulatory Visit | Attending: Neurology | Admitting: Neurology

## 2024-01-08 DIAGNOSIS — M353 Polymyalgia rheumatica: Secondary | ICD-10-CM | POA: Diagnosis not present

## 2024-01-08 DIAGNOSIS — G3184 Mild cognitive impairment, so stated: Secondary | ICD-10-CM | POA: Diagnosis not present

## 2024-01-08 LAB — GLUCOSE, CAPILLARY: Glucose-Capillary: 98 mg/dL (ref 70–99)

## 2024-01-08 MED ORDER — FLUDEOXYGLUCOSE F - 18 (FDG) INJECTION
9.9300 | Freq: Once | INTRAVENOUS | Status: AC | PRN
  Administered 2024-01-08: 9.93 via INTRAVENOUS

## 2024-01-11 DIAGNOSIS — K449 Diaphragmatic hernia without obstruction or gangrene: Secondary | ICD-10-CM | POA: Diagnosis not present

## 2024-01-11 DIAGNOSIS — Z860101 Personal history of adenomatous and serrated colon polyps: Secondary | ICD-10-CM | POA: Diagnosis not present

## 2024-01-11 DIAGNOSIS — D649 Anemia, unspecified: Secondary | ICD-10-CM | POA: Diagnosis not present

## 2024-01-11 DIAGNOSIS — D124 Benign neoplasm of descending colon: Secondary | ICD-10-CM | POA: Diagnosis not present

## 2024-01-11 DIAGNOSIS — D509 Iron deficiency anemia, unspecified: Secondary | ICD-10-CM | POA: Diagnosis not present

## 2024-01-11 DIAGNOSIS — K5730 Diverticulosis of large intestine without perforation or abscess without bleeding: Secondary | ICD-10-CM | POA: Diagnosis not present

## 2024-01-11 DIAGNOSIS — Z09 Encounter for follow-up examination after completed treatment for conditions other than malignant neoplasm: Secondary | ICD-10-CM | POA: Diagnosis not present

## 2024-01-11 DIAGNOSIS — K649 Unspecified hemorrhoids: Secondary | ICD-10-CM | POA: Diagnosis not present

## 2024-01-13 DIAGNOSIS — D124 Benign neoplasm of descending colon: Secondary | ICD-10-CM | POA: Diagnosis not present

## 2024-01-14 ENCOUNTER — Telehealth: Payer: Self-pay

## 2024-01-14 NOTE — Telephone Encounter (Signed)
 Order has been placed to [facility] for assessment.

## 2024-01-19 ENCOUNTER — Ambulatory Visit: Payer: Self-pay | Admitting: Neurology

## 2024-01-20 NOTE — Telephone Encounter (Signed)
 Pt called an informed the PET scan was normal. Symptoms may be at an early stage that the scan is still normal, this is the reason Dr Georjean would like him to proceed with [facility] evaluation. [facility] referral resent

## 2024-01-25 ENCOUNTER — Institutional Professional Consult (permissible substitution): Admitting: Psychology

## 2024-01-25 ENCOUNTER — Ambulatory Visit: Payer: Self-pay

## 2024-02-01 ENCOUNTER — Encounter: Admitting: Psychology

## 2024-02-23 DIAGNOSIS — G4733 Obstructive sleep apnea (adult) (pediatric): Secondary | ICD-10-CM | POA: Diagnosis not present

## 2024-02-29 ENCOUNTER — Ambulatory Visit: Payer: Medicare HMO | Admitting: Internal Medicine

## 2024-03-02 DIAGNOSIS — G5603 Carpal tunnel syndrome, bilateral upper limbs: Secondary | ICD-10-CM | POA: Diagnosis not present

## 2024-03-02 DIAGNOSIS — M353 Polymyalgia rheumatica: Secondary | ICD-10-CM | POA: Diagnosis not present

## 2024-03-02 DIAGNOSIS — F331 Major depressive disorder, recurrent, moderate: Secondary | ICD-10-CM | POA: Diagnosis not present

## 2024-03-02 DIAGNOSIS — I10 Essential (primary) hypertension: Secondary | ICD-10-CM | POA: Diagnosis not present

## 2024-03-09 DIAGNOSIS — R202 Paresthesia of skin: Secondary | ICD-10-CM | POA: Diagnosis not present

## 2024-03-09 DIAGNOSIS — G5603 Carpal tunnel syndrome, bilateral upper limbs: Secondary | ICD-10-CM | POA: Diagnosis not present

## 2024-03-10 ENCOUNTER — Telehealth: Payer: Self-pay | Admitting: Physician Assistant

## 2024-03-10 NOTE — Telephone Encounter (Signed)
 Spoke to patient, he currently has refills on Aricept .

## 2024-03-10 NOTE — Telephone Encounter (Signed)
 Pt calling for call bk, no other details asking for Christian Landry

## 2024-03-21 DIAGNOSIS — R4189 Other symptoms and signs involving cognitive functions and awareness: Secondary | ICD-10-CM | POA: Diagnosis not present

## 2024-03-21 DIAGNOSIS — G4700 Insomnia, unspecified: Secondary | ICD-10-CM | POA: Diagnosis not present

## 2024-03-21 DIAGNOSIS — F411 Generalized anxiety disorder: Secondary | ICD-10-CM | POA: Diagnosis not present

## 2024-03-23 NOTE — Progress Notes (Signed)
 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 [facility] hadn't replaced mal-fitting hose pending this visit. Otherwise compliant and doing ok. Not aware of RBD disturbance.  Attributes 25 lb weight loss to marital issues. Feels ok.  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  Using CPAP nightly Discussed the use of AI scribe software for clinical note transcription with the patient, who gave verbal consent to proceed.  History of Present Illness   Christian Landry is a 68 year old male with obstructive sleep apnea who presents for follow-up of their sleep apnea management.  He uses CPAP with pressures 5 to 12 cm H2O, typically 11.7 to 12, and notes 5 to 6 breakthrough apneas per hour.  Over the past month they have had more vivid dreams, which they associate with new psychiatric medications including mirtazapine   Their spouse has noticed increased movements during sleep such as striking the pillow, which they do not recall.  They have REM behavior disorder with dream enactment during sleep. Their spouse continues to observe concerning nocturnal movements, though there have been no injuries. He is  also followed by Neurology. I suggested that his Neurologist needs to weigh in on the RED issues.     Assessment and Plan:    Obstructive Sleep Apnea- benefits from CPAP CPAP therapy set between 5-12 cm H2O, primarily near 11.7-12 cm. Apnea index slightly above target at 5-6 per hour. Suggested increasing maximum pressure to 15 cm to reduce apnea index below 5 per hour. - Increase CPAP maximum pressure to 15 cm H2O. - Send order to home care company to adjust CPAP settings.  REM Behavior Disorder REM behavior disorder involves acting out dreams, potentially influenced by medications like mirtazapine . Advised against adding medications to counteract side effects. Coordination with neurologist recommended. - Keep appointment with memory neurologist on January 28th. - Coordinate care with neurologist Dr. Georjean for management.  Dementia Dementia complicates management of other conditions such as REM behavior disorder, acknowledging complexity of managing multiple medications.      ROS-see HPI   + = positive Constitutional:    weight loss, night sweats, fevers, chills, fatigue, lassitude. HEENT:    headaches, difficulty swallowing, tooth/dental problems, sore throat,       sneezing, itching, ear ache, nasal congestion, post nasal drip, snoring CV:    chest pain, orthopnea, PND, swelling in lower extremities, anasarca,                                   dizziness, palpitations Resp:   shortness of breath with exertion or at rest.                productive cough,   non-productive cough, coughing up of blood.  change in color of mucus.  wheezing.   Skin:    rash or lesions. GI:  No-   heartburn, indigestion, abdominal pain, nausea, vomiting, diarrhea,                 change in bowel habits, loss of appetite GU: dysuria, change in color of urine, no urgency or frequency.   flank pain. MS:   joint pain, stiffness, decreased range of motion, back pain. Neuro-     nothing  unusual Psych:  change in mood or affect.  depression or anxiety.   memory loss.  OBJ- Physical Exam General- Alert, Oriented, Affect-appropriate, Distress- none acute, +Lean Skin- rash-none, lesions- none, excoriation- none Lymphadenopathy- none Head- atraumatic            Eyes- Gross vision intact, PERRLA, conjunctivae and secretions clear            Ears- Hearing, canals-normal            Nose- Clear, no-Septal dev, mucus, polyps, erosion, perforation             Throat- Mallampati III-IV , mucosa clear , drainage- none, tonsils- atrophic, + teeth Neck- flexible , trachea midline, no stridor , thyroid nl, carotid no bruit Chest - symmetrical excursion , unlabored           Heart/CV- RRR , no murmur , no gallop  , no rub, nl s1 s2                           - JVD- none , edema- none, stasis changes- none, varices- none           Lung- clear to P&A, wheeze- none, cough- none , dullness-none, rub- none           Chest wall-  Abd-  Br/ Gen/ Rectal- Not done, not indicated Extrem- cyanosis- none, clubbing, none, atrophy- none, strength- nl Neuro- grossly intact to observation

## 2024-03-24 ENCOUNTER — Encounter: Payer: Self-pay | Admitting: Internal Medicine

## 2024-03-24 ENCOUNTER — Ambulatory Visit: Admitting: Internal Medicine

## 2024-03-24 VITALS — BP 108/60 | HR 93 | Temp 97.8°F | Ht 70.0 in | Wt 193.0 lb

## 2024-03-24 DIAGNOSIS — F0390 Unspecified dementia without behavioral disturbance: Secondary | ICD-10-CM

## 2024-03-24 DIAGNOSIS — G4733 Obstructive sleep apnea (adult) (pediatric): Secondary | ICD-10-CM

## 2024-03-24 DIAGNOSIS — G4732 High altitude periodic breathing: Secondary | ICD-10-CM | POA: Diagnosis not present

## 2024-03-24 NOTE — Patient Instructions (Addendum)
 Order- DME [facility]- please increase autopap range to 5-15, continue mask of choice,supplies, AirView/ card  I suggest you talk with your Neurologists about the increased dreaming, since it my be related to the medications you are taking. You have an appointment with Camie Sevin, PA in January.  At check out today, ask the front desk to bring you back with one of our sleep doctors, since I am retiring.

## 2024-03-30 ENCOUNTER — Other Ambulatory Visit: Payer: Self-pay

## 2024-03-30 DIAGNOSIS — R202 Paresthesia of skin: Secondary | ICD-10-CM

## 2024-04-01 ENCOUNTER — Ambulatory Visit: Payer: Self-pay | Admitting: Neurology

## 2024-04-01 DIAGNOSIS — R202 Paresthesia of skin: Secondary | ICD-10-CM | POA: Diagnosis not present

## 2024-04-01 DIAGNOSIS — G5603 Carpal tunnel syndrome, bilateral upper limbs: Secondary | ICD-10-CM

## 2024-04-01 NOTE — Procedures (Signed)
 " [facility]  [address], [address]  [place], [place] [address] Tel: [phone] Fax: [phone] Test Date:  04/01/2024  Patient: Christian Landry DOB: 1955-08-09 Physician: Tonita Blanch, DO  Sex: Male Height: 5' 10 Ref Phys: Evalene Chancy, MD  ID#: [id number]   Technician:    History: This is a 68 year old man referred for evaluation of bilateral hand paresthesias, worse on the left.  NCV & EMG Findings: Extensive electrodiagnostic testing of the left upper extremity and additional studies of the right shows:  Left median sensory response is absent.  Right median sensory response shows prolonged latency (4.9 ms) and reduced amplitude (8.2 V).  Bilateral ulnar sensory responses are within normal limits. Left median motor response is absent.  Right median motor response shows prolonged latency (5.4 ms).  Bilateral ulnar motor responses are within normal limits. Fibrillation potentials are seen in the left abductor pollicis brevis muscle, and despite maximal activation, no motor unit recruitment is seen.  Remaining tested muscles showed normal motor unit recruitment and motor unit configuration.  Impression: Bilateral median neuropathy at or distal to the wrist, consistent with a clinical diagnosis of carpal tunnel syndrome, very severe on the left and severe on the right.    ___________________________ Tonita Blanch, DO    Nerve Conduction Studies   Stim Site NR Peak (ms) Norm Peak (ms) O-P Amp (V) Norm O-P Amp  Left Median Anti Sensory (2nd Digit)  32 C  Wrist *NR  <3.8  >10  Right Median Anti Sensory (2nd Digit)  32 C  Wrist    *4.9 <3.8 *8.2 >10  Left Ulnar Anti Sensory (5th Digit)  32 C  Wrist    3.2 <3.2 13.8 >5  Right Ulnar Anti Sensory (5th Digit)  32 C  Wrist    3.0 <3.2 10.4 >5     Stim Site NR Onset (ms) Norm Onset (ms) O-P Amp (mV) Norm O-P Amp Site1 Site2 Delta-0 (ms) Dist (cm) Vel (m/s) Norm Vel (m/s)  Left Median Motor (Abd Poll Brev)  32 C   Wrist *NR  <4.0  >5 Elbow Wrist  0.0  >50  Elbow *NR            Right Median Motor (Abd Poll Brev)  32 C  Wrist    *5.4 <4.0 6.1 >5 Elbow Wrist 5.7 33.0 58 >50  Elbow    11.1  5.6         Left Ulnar Motor (Abd Dig Minimi)  32 C  Wrist    2.7 <3.1 10.1 >7 B Elbow Wrist 3.9 23.0 59 >50  B Elbow    6.6  9.8  A Elbow B Elbow 1.9 10.0 53 >50  A Elbow    8.5  9.7         Right Ulnar Motor (Abd Dig Minimi)  32 C  Wrist    2.7 <3.1 8.2 >7 B Elbow Wrist 3.9 23.0 59 >50  B Elbow    6.6  8.0  A Elbow B Elbow 1.8 10.0 56 >50  A Elbow    8.4  7.7          Electromyography   Side Muscle Ins.Act Fibs Fasc Recrt Amp Dur Poly Activation Comment  Right 1stDorInt Nml Nml Nml Nml Nml Nml Nml Nml N/A  Right Abd Poll Brev Nml Nml Nml Nml Nml Nml Nml Nml N/A  Right PronatorTeres Nml Nml Nml Nml Nml Nml Nml Nml N/A  Right Biceps Nml Nml Nml  Nml Nml Nml Nml Nml N/A  Right Triceps Nml Nml Nml Nml Nml Nml Nml Nml N/A  Right Deltoid Nml Nml Nml Nml Nml Nml Nml Nml N/A  Left 1stDorInt Nml Nml Nml Nml Nml Nml Nml Nml N/A  Left Abd Poll Brev Nml *1+ Nml *None *- *- *- Nml *ATR  Left PronatorTeres Nml Nml Nml Nml Nml Nml Nml Nml N/A  Left Biceps Nml Nml Nml Nml Nml Nml Nml Nml N/A  Left Triceps Nml Nml Nml Nml Nml Nml Nml Nml N/A  Left Deltoid Nml Nml Nml Nml Nml Nml Nml Nml N/A      Waveforms:                    "

## 2024-04-29 ENCOUNTER — Ambulatory Visit: Admitting: Physician Assistant

## 2024-05-02 NOTE — Progress Notes (Signed)
 "   Mild Cognitive Impairment, concern for Alzheimer's disease   Christian Landry is a delightful 69 y.o. RH male with a history of anemia, B12 deficiency, hypertension, hyperlipidemia, PMR, and a diagnosis of mild cognitive impairment with concerns for Alzheimer's disease*** presenting today in follow-up for evaluation of memory loss.  Lumbar puncture was done for CSF and biomarkers, unfortunately with technical difficulties with specimen exceeding stability for test requested on arrival to the [facility] lab.  Biomarkers showed normal ABeta 42:40 ratio and elevated P tau to 17 and P tau 181 that show an increased risk for Alzheimer's disease.  Metabolic PET scan on October 2025, was normal.  As symptoms may be an early stage that this can still normal, a referral to [facility] neurology memory clinic has been placed.***Patient is on donepezil  10 mg daily by behavioral therapy***. Patient was last seen on 12/28/2023 by Dr. Georjean***. Memory is ***. MMSE today is  /30. Patient is able to participate on ADLs and to to drive without difficulties. Mood is managed by behavioral health***. This patient is accompanied in the office by his wife***  who supplements the history. Previous records as well as any outside records available were reviewed prior to todays visit   Follow up in  *** months Continue donepezil  10 mg daily by behavioral health. 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   Continue to control mood as per [facility], continue psychotherapy for adjustment disorder Follow OSA by pulmonology, continue using CPAP Recommend good control of cardiovascular risk factors Monitor driving.      Discussed the use of AI scribe software for clinical note transcription with the patient, who gave verbal consent to proceed.  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   Consider discontinuing mirtazapine  to counteract side effects as indicated by pulmonology.  Patient has been instructed that we do not treat REM behavior at this office.  NCV EMG 04/01/2024 remarkable for bilateral median neuropathy at or distal to the wrist, consistent with carpal tunnel syndrome, very severe on the left, severe on the right, now followed by orthopedics.     History on Initial Assessment by Camie Sevin PA-C on 03/11/2021: The patient is seen in neurologic consultation at the request of Christian Charleston, MD for the evaluation of memory.  The patient is accompanied by his wife who supplements the history. This is a 69 y.o. year old male who has had gradual memory issues for about 2 or 3 years, worse over the last year.  His wife noticed that the symptoms are worse over the last year.  She states that he is overloaded with work, he is participating in [employer], and despite being very organized, over the last year he has been having trouble with organization.  He usually keeps a pocket calendar, but he has been misplacing it, which creates some frustration.  He has to write it in a piece of paper otherwise he becomes very frustrated .  She also reports that he is more confused, cannot complete a task, has trouble finding objects, and becomes overloaded .  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  She has a history of anxiety, and this creates increased anxiety in her and in the relationship according to her.  He may go to the basement and can find something  very quickly, but he cannot remember if he lost something in the first place unless he speaks out loud .  He likes to cook, denies leaving the stove on.  His wife states that he has lost recipes that usually are in the collection of recipes in the pantry.  She states that his response will be I do not remember that I was looking for them . He puts his clean laundry in the  basket and rolls  the socks in a  specific way but lately he has  randomly tied them in a knot  which was uncharacteristic. He does asked the same questions and repeats the same stories. He continues to drive, and recently, they had not slept for about 2 days due to their son's wedding, which increased stress during that time, and upon turning into the driveway, he lost direction of the car.  As mentioned earlier, he reports this is not related to confusion, it was a lack of sleep .  Of note, he reports that goes to sleep at midnight, may wake up between 2 and 3 in the morning, I only sleep about 3 hours and go to the office at 6:30 AM .  His wife states that he snores loudly.  He has never been tested for sleep apnea.   He denies a history of depression, but he does report that he is sad  that  this is making her sad. He denies hallucinations or paranoia, no hygiene concerns are noted, he is independent of bathing and dressing.  He does not miss any medication doses, and he is independent on finances, does not forget to pay any bills.  His appetite is normal, denies trouble swallowing.  He ambulates without assistance of cane or walker, denies any recent falls.  Years ago, he had a branch of a tree falling into his forehead, leaving a scar, but he denies that this was deep, he reports that the injury was superficial, no loss of consciousness.Denies headaches, double vision, dizziness, focal numbness or tingling, unilateral weakness or tremors or anosmia. No history of seizures. Denies urine incontinence, retention, constipation or diarrhea.  Denies ETOH or Tobacco. Family History remarkable for both parents with dementia, and one  maternal grand aunt with dementia possibly Alzheimer's.   Diagnostic Data:  MRI brain without contrast 11/2023 no acute changes, cerebral volume stable since 2022, within normal limits for age.   Neuropsychological evaluation 08/2021:  Neuropsychological functioning within normal limits. Perceptions of his  functioning held by himself and his wife (who was not present during the current interview) appear extremely disparate from one another. While he essentially denied all cognitive difficulties, personality changes, behavioral concerns, and REM sleep behaviors, his wife has noted ongoing cognitive decline, described his behavior as erratic, noted ongoing personality changes to the point where she has seemed fearful, and described significant REM sleep behaviors. Given these disputing reports, I am unable to determine which is the more accurate presentation. There certainly remains the potential that Mr. Lakins attempted to present himself in a favorable light during the current interview and actively denied all concerns. This also appears to be consistent throughout much of his medical records as his wife commonly reports far greater concerns.    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 Behavioral disinhibition and personality changes are the hallmark characteristic of this illness and  his age is certainly within the expected range when this illness most commonly presents itself. Cognitive dysfunction can be minimal or even completely absent across objective testing when in earlier stages, meaning that currently benign testing results would not exclude this illness from being present. This illness remains plausible at the present time and continued medical monitoring will be important moving forward.    Repeat Neuropsychological evaluation 10/2023:  Mild Neurocognitive Disorder (mild cognitive impairment).  The pattern of performance is suggestive of impairment surrounding complex attention and delayed retrieval aspects of verbal memory. Further variability was exhibited across both encoding (i.e., learning) and recognition/consolidation  aspects of verbal memory. Performances were appropriate relative to age-matched peers across all other assessed domains. This includes processing speed, basic attention, executive functioning, safety/judgment, receptive and expressive language, visuospatial abilities, and all aspects of visual learning and memory.   Relative to his previous evaluation in May 2023, noteworthy decline was exhibited across verbal memory and complex attention. Outside of this, all other assessed domains exhibited stability over time.    The etiology for ongoing memory decline is uncertain. Progressive memory decline does increase concern for an underlying neurodegenerative illness. Across verbal memory testing, Mr. Davie was fully amnestic (i.e., 0% retention) across 2/3 tasks and only exhibited 27% retention across the remaining task. This certainly raises concern for rapid forgetting. This, when combined with variable benefit from cueing and the potential for an evolving storage impairment, could elevate concerns for Alzheimer's disease. There is also a known family history of this illness. It remains encouraging that visual memory has remained stable and intact, as has all non-memory domains commonly implicated by this illness. If Alzheimer's disease is indeed present, it would appear to be in very early stages at the present time.    The potential for the behavioral variant of frontotemporal dementia (bvFTD) was discussed at the time of his previous evaluation. This was primarily brought on due to his wife's reporting of Mr. Ritson exhibiting very severe personality changes including aggression and disinhibition. He denied any personality changes currently, as well as back in 2023. Given his continued denial and there being no collateral source of information during the present interview, I remain unable to validate the true extent of personality changes. Executive functioning and language abilities remained stable and  appropriate across the current evaluation and an amnestic rapid forgetting memory profile is typically more associated with Alzheimer's disease than bvFTD in early stages of either illness. Testing may favor the former at the present time. Continued medical monitoring will be important moving forward.   Lumbar puncture 11/2023:  normal CSF cell count, protein 64 CSF Protein 14-3-3: 7,760 (ref [id number]) RT-QUIC: negative CSF [facility] paraneoplastic panel negative Unfortunately unable to perform CSF AD biomarkers: the specimen exceeds stability for the test requested   Serum biomarkers done at [facility] 11/2023: Abeta 42/40 ratio: 0.172 (lower risk) eUjl782: 0.39 (ref <0.15) pTau181: 1.73 (ref<1.07)  Metabolic PET scan 01/08/2024, personally reviewed, without decreased relative cortical metabolism to suggest Alzheimer's type disease pathology or frontotemporal dementia.      Past Medical History:  Diagnosis Date   Adverse effect of drug 10/27/2023   Dietary iron deficiency anemia    Diverticulosis of colon    Erectile dysfunction    Essential hypertension    History of adenomatous polyp of colon    Major depressive disorder    Mild cognitive impairment with memory loss 10/28/2023   Mixed hyperlipidemia    OSA (obstructive sleep apnea) 04/15/2021  HST 08/23/21- AHI 39.8/ hr, desaturation to 82%, body weight 182 lbs     REM behavioral disorder 04/15/2021   Sciatica    Snoring 04/15/2021   Vitamin B12 deficiency      No past surgical history on file.       Objective:     PHYSICAL EXAMINATION:    VITALS:  There were no vitals filed for this visit.  GEN:  The patient appears stated age and is in NAD. HEENT:  Normocephalic, atraumatic.   Neurological examination:  General: NAD, well-groomed, appears stated age. Orientation: The patient is alert. Oriented to person, place and not to date.*** Cranial nerves: There is good facial symmetry.The speech is fluent and clear. No  aphasia or dysarthria.  Anxious appearing, easily distracted by the sound of his phone***.  Fund of knowledge is appropriate. Recent   and remote memory is normal.  Attention and concentration are normal.  Able to name objects and repeat phrases.  Hearing is intact to conversational tone ***.   Delayed recall *** Sensation: Sensation is intact to light touch throughout Motor: Strength is at least antigravity x4. DTR's 2/4 in UE/LE      03/11/2021    8:00 AM  Montreal Cognitive Assessment   Visuospatial/ Executive (0/5) 5  Naming (0/3) 3  Attention: Read list of digits (0/2) 2  Attention: Read list of letters (0/1) 1  Attention: Serial 7 subtraction starting at 100 (0/3) 3  Language: Repeat phrase (0/2) 2  Language : Fluency (0/1) 1  Abstraction (0/2) 2  Delayed Recall (0/5) 2  Orientation (0/6) 6  Total 27       10/16/2023    4:00 PM  MMSE - Mini Mental State Exam  Orientation to time 5  Orientation to Place 5  Registration 3  Attention/ Calculation 5  Recall 3  Language- name 2 objects 2  Language- repeat 1  Language- follow 3 step command 3  Language- read & follow direction 1  Write a sentence 1  Copy design 1  Total score 30      Movement examination: Tone: There is normal tone in the UE/LE Abnormal movements:  no tremor.  No myoclonus.  No asterixis.   Coordination:  There is no decremation with RAM's. Normal finger to nose  Gait and Station: The patient has no difficulty arising out of a deep-seated chair without the use of the hands. The patient's stride length is good.  Gait is cautious and narrow.   Thank you for allowing us  the opportunity to participate in the care of this nice patient. Please do not hesitate to contact us  for any questions or concerns.   Total time spent on today's visit was *** minutes dedicated to this patient today, preparing to see patient, examining the patient, ordering tests and/or medications and counseling the patient, documenting  clinical information in the EHR or other health record, independently interpreting results and communicating results to the patient/family, discussing treatment and goals, answering patient's questions and coordinating care.  Cc:  Auston Milo ROSALEA [facility] 05/02/2024 12:24 PM      "

## 2024-05-04 ENCOUNTER — Encounter: Payer: Self-pay | Admitting: Physician Assistant

## 2024-05-04 ENCOUNTER — Ambulatory Visit: Admitting: Physician Assistant

## 2024-05-04 VITALS — BP 152/77 | HR 58 | Resp 20 | Ht 70.0 in | Wt 198.0 lb

## 2024-05-04 DIAGNOSIS — R413 Other amnesia: Secondary | ICD-10-CM | POA: Diagnosis not present

## 2024-05-04 NOTE — Patient Instructions (Addendum)
 Referral to [facility] for 2nd opinion for Alzheimer's disease  Follow up after the consult with [facility]

## 2024-09-22 ENCOUNTER — Encounter: Admitting: Pulmonary Disease
